# Patient Record
Sex: Female | Born: 1990 | Race: Black or African American | Hispanic: No | Marital: Married | Smoking: Never smoker
Health system: Southern US, Community
[De-identification: ages and names within clinical notes are randomized; demographics above are authoritative.]

## PROBLEM LIST (undated history)

## (undated) ENCOUNTER — Inpatient Hospital Stay (HOSPITAL_COMMUNITY): Payer: Self-pay

## (undated) DIAGNOSIS — T4145XA Adverse effect of unspecified anesthetic, initial encounter: Secondary | ICD-10-CM

## (undated) DIAGNOSIS — Z789 Other specified health status: Secondary | ICD-10-CM

## (undated) DIAGNOSIS — G971 Other reaction to spinal and lumbar puncture: Secondary | ICD-10-CM

## (undated) DIAGNOSIS — T8859XA Other complications of anesthesia, initial encounter: Secondary | ICD-10-CM

## (undated) DIAGNOSIS — L309 Dermatitis, unspecified: Secondary | ICD-10-CM

## (undated) DIAGNOSIS — N907 Vulvar cyst: Secondary | ICD-10-CM

## (undated) HISTORY — PX: EYE SURGERY: SHX253

## (undated) HISTORY — PX: OTHER SURGICAL HISTORY: SHX169

## (undated) HISTORY — PX: TONSILLECTOMY: SUR1361

## (undated) HISTORY — DX: Dermatitis, unspecified: L30.9

---
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
## 2014-05-28 ENCOUNTER — Emergency Department (INDEPENDENT_AMBULATORY_CARE_PROVIDER_SITE_OTHER)
Admission: EM | Admit: 2014-05-28 | Discharge: 2014-05-28 | Disposition: A | Discharge status: Home / Self Care | Payer: BC Managed Care – PPO | Source: Home / Self Care | Attending: Emergency Medicine | Admitting: Emergency Medicine

## 2014-05-28 ENCOUNTER — Encounter (HOSPITAL_COMMUNITY): Payer: Self-pay | Admitting: Emergency Medicine

## 2014-05-28 DIAGNOSIS — Z3201 Encounter for pregnancy test, result positive: Secondary | ICD-10-CM | POA: Diagnosis not present

## 2014-05-28 DIAGNOSIS — R110 Nausea: Secondary | ICD-10-CM

## 2014-05-28 DIAGNOSIS — Z3490 Encounter for supervision of normal pregnancy, unspecified, unspecified trimester: Secondary | ICD-10-CM

## 2014-05-28 LAB — POCT PREGNANCY, URINE: Preg Test, Ur: POSITIVE — AB

## 2014-05-28 NOTE — ED Provider Notes (Signed)
CSN: [id number]     Arrival date & time 05/28/14  1738 History   First MD Initiated Contact with Patient 05/28/14 1853     Chief Complaint  Patient presents with  . Emesis  . Fatigue   (Consider location/radiation/quality/duration/timing/severity/associated sxs/prior Treatment) HPI Comments: Patient presents with a 5 day history of nausea; worse in the am, with "overwhelming" fatigue and sensitivity to the smell. She reports a possible subjective fever on Monday; without a return. Denies abdominal pain, diarrhea or known exposures. Denies urinary symptoms. See remainder of ROS.   Patient is a 24 y.o. female presenting with vomiting. The history is provided by the patient.  Emesis Associated symptoms: no diarrhea     History reviewed. No pertinent past medical history. History reviewed. No pertinent past surgical history. History reviewed. No pertinent family history. History  Substance Use Topics  . Smoking status: Never Smoker   . Smokeless tobacco: Not on file  . Alcohol Use: No   OB History    No data available     Review of Systems  Gastrointestinal: Positive for vomiting. Negative for diarrhea, constipation, abdominal distention and rectal pain.  Genitourinary: Negative for dysuria, urgency, frequency, flank pain, vaginal bleeding, vaginal discharge, pelvic pain and dyspareunia.  Musculoskeletal: Negative.   Psychiatric/Behavioral: Negative.   All other systems reviewed and are negative.   Allergies  Review of patient's allergies indicates no known allergies.  Home Medications   Prior to Admission medications   Not on File   BP 122/70 mmHg  Pulse 66  Temp(Src) 98.3 F (36.8 C) (Oral)  Resp 12  SpO2 100%  LMP 05/05/2014 Physical Exam  Constitutional: She is oriented to person, place, and time. She appears well-developed and well-nourished. No distress.  HENT:  Head: Normocephalic and atraumatic.  Pulmonary/Chest: Effort normal and breath sounds normal.   Abdominal: Soft. There is no tenderness. There is no rebound and no guarding.  Neurological: She is alert and oriented to person, place, and time.  Skin: Skin is warm and dry. She is not diaphoretic.  Psychiatric: Her behavior is normal.  Nursing note and vitals reviewed.   ED Course  Procedures (including critical care time) Labs Review Labs Reviewed  POCT PREGNANCY, URINE - Abnormal; Notable for the following:    Preg Test, Ur POSITIVE (*)    All other components within normal limits    Imaging Review No results found.   MDM   1. Pregnant   2. Nausea    Couple happy with news. No PE findings or symptoms to suggest another cause of her symptoms at this time. Recommend GYN, initiation of Pre-natal vitamins and use of Emetrol if needed for nausea. Education given. F/U if worsening symptoms.     Riki SheerMichelle G Thomasena Vandenheuvel, PA-C 05/28/14 1934

## 2014-05-28 NOTE — Discharge Instructions (Signed)
Medicines During Pregnancy During pregnancy, there are medicines that are either safe or unsafe to take. Medicines include prescriptions from your caregiver, over-the-counter medicines, topical creams applied to the skin, and all herbal substances. Medicines are put into either Class A, B, C, or D. Class A and B medicines have been shown to be safe in pregnancy. Class C medicines are also considered to be safe in pregnancy, but these medicines should only be used when necessary. Class D medicines should not be used at all in pregnancy. They can be harmful to a baby.  It is best to take as little medicine as possible while pregnant. However, some medicines are necessary to take for the mother and baby's health. Sometimes, it is more dangerous to stop taking certain medicines than to stay on them. This is often the case for people with long-term (chronic) conditions such as asthma, diabetes, or high blood pressure (hypertension). If you are pregnant and have a chronic illness, call your caregiver right away. Bring a list of your medicines and their doses to your appointments. If you are planning to become pregnant, schedule a doctor's appointment and discuss your medicines with your caregiver. Lastly, write down the phone number to your pharmacist. They can answer questions regarding a medicine's class and safety. They cannot give advice as to whether you should or should not be on a medicine.  SAFE AND UNSAFE MEDICINES There is a long list of medicines that are considered safe in pregnancy. Below is a shorter list. For specific medicines, ask your caregiver.  AllergyMedicines Loratadine, cetirizine, and chlorpheniramine are safe to take. Certain nasal steroid sprays are safe. Talk to your caregiver about specific brands that are safe. Analgesics Acetaminophen and acetaminophen with codeine are safe to take. All other nonsteroidal anti-inflammatory drugs (NSAIDS) are not safe. This includes ibuprofen.    Antacids Many over-the-counter antacids are safe to take. Talk to your caregiver about specific brands that are safe. Famotidine, ranitidine, and lansoprazole are safe. Omepresole is considered safe to take in the second trimester. Antibiotic Medicines There are several antibiotics to avoid. These include, but are not limited to, tetracyline, quinolones, and sulfa medications. Talk to your caregiver before taking any antibiotic.  Antihistamines Talk to your caregiver about specific brands that are safe.  Asthma Medicines Most asthma steroid inhalers are safe to take. Talk to your caregiver for specific details. Calcium Calcium supplements are safe to take. Do not take oyster shell calcium.  Cough and Cold Medicines It is safe to take products with guaifenesin or dextromethorphan. Talk to your caregiver about specific brands that are safe. It is not safe to take products that contain aspirin or ibuprofen. Decongestant Medicines Pseudoephedrine-containing products are safe to take in the second and third trimester.  Depression Medicines Talk about these medicines with your caregiver.  Antidiarrheal Medicines It is safe to take loperamide. Talk to your caregiver about specific brands that are safe. It is not safe to take any antidiarrheal medicine that contains bismuth. Eyedrops Allergy eyedrops should be limited.  Iron It is safe to use certain iron-containing medicines for anemia in pregnancy. They require a prescription.  Antinausea Medicines It is safe to take doxylamine and vitamin B6 as directed. There are other prescription medicines available, if needed.  Sleep aids It is safe to take diphenhydramine and acetaminophen with diphenhydramine.  Steroids Hydrocortisone creams are safe to use as directed. Oral steroids require a prescription. It is not safe to take any hemorrhoid cream with pramoxine or phenylephrine.  Stool softener It is safe to take stool softener  medicines. Avoid daily or prolonged use of stool softeners. Thyroid Medicine It is important to stay on this thyroid medicine. It needs to be followed by your caregiver.  Vaginal Medicines Your caregiver will prescribe a medicine to you if you have a vaginal infection. Certain antifungal medicines are safe to use if you have a sexually transmitted infection (STI). Talk to your caregiver.  Document Released: 03/17/2005 Document Revised: 06/09/2011 Document Reviewed: 03/18/2011 [facility] Patient Information 2015 [employer], [place]. This information is not intended to replace advice given to you by your health care provider. Make sure you discuss any questions you have with your health care provider.     Ok to try The Sherwin-WilliamsEmetrol OTC for nausea. Start Prenatal vitamins. Call number provided for an appt.

## 2014-05-28 NOTE — ED Notes (Signed)
C/o  N/v.  Fatigue.  Fever.   And sensitivity to smell.  On set Monday.  Symptoms gradually getting worse.

## 2014-06-28 LAB — OB RESULTS CONSOLE HEPATITIS B SURFACE ANTIGEN: Hepatitis B Surface Ag: NEGATIVE

## 2014-06-28 LAB — OB RESULTS CONSOLE RUBELLA ANTIBODY, IGM: Rubella: IMMUNE

## 2014-06-28 LAB — OB RESULTS CONSOLE HIV ANTIBODY (ROUTINE TESTING): HIV: NONREACTIVE

## 2014-06-28 LAB — OB RESULTS CONSOLE GC/CHLAMYDIA
Chlamydia: NEGATIVE
GC PROBE AMP, GENITAL: NEGATIVE

## 2014-06-28 LAB — OB RESULTS CONSOLE ABO/RH: RH Type: POSITIVE

## 2014-06-28 LAB — OB RESULTS CONSOLE RPR: RPR: NONREACTIVE

## 2014-06-28 LAB — OB RESULTS CONSOLE ANTIBODY SCREEN: Antibody Screen: NEGATIVE

## 2014-10-11 ENCOUNTER — Inpatient Hospital Stay (HOSPITAL_COMMUNITY): Admission: AD | Admit: 2014-10-11 | Payer: Self-pay | Source: Ambulatory Visit | Admitting: Obstetrics and Gynecology

## 2014-12-28 LAB — OB RESULTS CONSOLE GBS: STREP GROUP B AG: NEGATIVE

## 2015-01-22 ENCOUNTER — Inpatient Hospital Stay (HOSPITAL_COMMUNITY)
Admission: AD | Admit: 2015-01-22 | Discharge: 2015-01-22 | Disposition: A | Discharge status: Home / Self Care | Payer: Medicaid Other | Source: Ambulatory Visit | Attending: Obstetrics & Gynecology | Admitting: Obstetrics & Gynecology

## 2015-01-22 ENCOUNTER — Encounter (HOSPITAL_COMMUNITY): Payer: Self-pay | Admitting: *Deleted

## 2015-01-22 DIAGNOSIS — Z3483 Encounter for supervision of other normal pregnancy, third trimester: Secondary | ICD-10-CM

## 2015-01-22 DIAGNOSIS — Z3A40 40 weeks gestation of pregnancy: Secondary | ICD-10-CM | POA: Insufficient documentation

## 2015-01-22 DIAGNOSIS — O471 False labor at or after 37 completed weeks of gestation: Secondary | ICD-10-CM | POA: Diagnosis not present

## 2015-01-22 HISTORY — DX: Other specified health status: Z78.9

## 2015-01-22 NOTE — MAU Provider Note (Signed)
Holly Glass is a 24 y.o. G1P0 at 40.0 weeks presented unannounced c/o ctx.  Denies vb orlof w+fm   History     There are no active problems to display for this patient.   No chief complaint on file.  HPI  OB History    Gravida Para Term Preterm AB TAB SAB Ectopic Multiple Living   1               Past Medical History  Diagnosis Date  . Medical history non-contributory     Past Surgical History  Procedure Laterality Date  . Tonsillectomy    . Eye surgery    . Addenoidectomy      Family History  Problem Relation Age of Onset  . Hypertension Mother   . Hypertension Father   . Arthritis Maternal Grandmother   . Heart disease Maternal Grandmother   . Cancer Maternal Grandfather   . Alcohol abuse Paternal Grandmother     Social History  Substance Use Topics  . Smoking status: Never Smoker   . Smokeless tobacco: None  . Alcohol Use: No    Allergies:  Allergies  Allergen Reactions  . Zyrtec Allergy [Cetirizine Hcl] Hives    Only Liquid zyrtec gives allergy    Prescriptions prior to admission  Medication Sig Dispense Refill Last Dose  . prenatal vitamin w/FE, FA (PRENATAL 1 + 1) 27-1 MG TABS tablet Take 1 tablet by mouth daily at 12 noon.   01/22/2015 at Unknown time    ROS See HPI above, all other systems are negative  Physical Exam   Blood pressure 144/83, pulse 88, temperature 98.5 F (36.9 C), temperature source Oral, resp. rate 18, height 5' (1.524 m), weight 205 lb (92.987 kg), last menstrual period 05/05/2014, SpO2 100 %.  Physical Exam Ext:  WNL ABD: Soft, non tender to palpation, no rebound or guarding SVE: FP/T/H   ED Course  Assessment: IUP at  40.0weeks Membranes: intact FHR: Category 1 CTX:  None/occasional   Plan: -Discussed need to follow up in office  -Bleeding and labor Precautions -Encouraged to call if any questions or concerns arise prior to next scheduled office visit.  -Discharged to home in stable condition -Kick  counts    Holly Glass, CNM, MSN 01/22/2015. 11:11 PM

## 2015-01-22 NOTE — MAU Note (Signed)
Pt reports contractions all day today, also reports a gush of fluid on Saturday but none since.

## 2015-01-22 NOTE — Discharge Instructions (Signed)
Fetal Movement Counts  Patient Name: __________________________________________________ Patient Due Date: ____________________  Performing a fetal movement count is highly recommended in high-risk pregnancies, but it is good for every pregnant woman to do. Your health care provider may ask you to start counting fetal movements at 28 weeks of the pregnancy. Fetal movements often increase:  · After eating a full meal.  · After physical activity.  · After eating or drinking something sweet or cold.  · At rest.  Pay attention to when you feel the baby is most active. This will help you notice a pattern of your baby's sleep and wake cycles and what factors contribute to an increase in fetal movement. It is important to perform a fetal movement count at the same time each day when your baby is normally most active.   HOW TO COUNT FETAL MOVEMENTS  1. Find a quiet and comfortable area to sit or lie down on your left side. Lying on your left side provides the best blood and oxygen circulation to your baby.  2. Write down the day and time on a sheet of paper or in a journal.  3. Start counting kicks, flutters, swishes, rolls, or jabs in a 2-hour period. You should feel at least 10 movements within 2 hours.  4. If you do not feel 10 movements in 2 hours, wait 2-3 hours and count again. Look for a change in the pattern or not enough counts in 2 hours.  SEEK MEDICAL CARE IF:  · You feel less than 10 counts in 2 hours, tried twice.  · There is no movement in over an hour.  · The pattern is changing or taking longer each day to reach 10 counts in 2 hours.  · You feel the baby is not moving as he or she usually does.  Date: ____________ Movements: ____________ Start time: ____________ Finish time: ____________   Date: ____________ Movements: ____________ Start time: ____________ Finish time: ____________  Date: ____________ Movements: ____________ Start time: ____________ Finish time: ____________  Date: ____________ Movements:  ____________ Start time: ____________ Finish time: ____________  Date: ____________ Movements: ____________ Start time: ____________ Finish time: ____________  Date: ____________ Movements: ____________ Start time: ____________ Finish time: ____________  Date: ____________ Movements: ____________ Start time: ____________ Finish time: ____________  Date: ____________ Movements: ____________ Start time: ____________ Finish time: ____________   Date: ____________ Movements: ____________ Start time: ____________ Finish time: ____________  Date: ____________ Movements: ____________ Start time: ____________ Finish time: ____________  Date: ____________ Movements: ____________ Start time: ____________ Finish time: ____________  Date: ____________ Movements: ____________ Start time: ____________ Finish time: ____________  Date: ____________ Movements: ____________ Start time: ____________ Finish time: ____________  Date: ____________ Movements: ____________ Start time: ____________ Finish time: ____________  Date: ____________ Movements: ____________ Start time: ____________ Finish time: ____________   Date: ____________ Movements: ____________ Start time: ____________ Finish time: ____________  Date: ____________ Movements: ____________ Start time: ____________ Finish time: ____________  Date: ____________ Movements: ____________ Start time: ____________ Finish time: ____________  Date: ____________ Movements: ____________ Start time: ____________ Finish time: ____________  Date: ____________ Movements: ____________ Start time: ____________ Finish time: ____________  Date: ____________ Movements: ____________ Start time: ____________ Finish time: ____________  Date: ____________ Movements: ____________ Start time: ____________ Finish time: ____________   Date: ____________ Movements: ____________ Start time: ____________ Finish time: ____________  Date: ____________ Movements: ____________ Start time: ____________ Finish  time: ____________  Date: ____________ Movements: ____________ Start time: ____________ Finish time: ____________  Date: ____________ Movements: ____________ Start time:   ____________ Finish time: ____________  Date: ____________ Movements: ____________ Start time: ____________ Finish time: ____________  Date: ____________ Movements: ____________ Start time: ____________ Finish time: ____________  Date: ____________ Movements: ____________ Start time: ____________ Finish time: ____________   Date: ____________ Movements: ____________ Start time: ____________ Finish time: ____________  Date: ____________ Movements: ____________ Start time: ____________ Finish time: ____________  Date: ____________ Movements: ____________ Start time: ____________ Finish time: ____________  Date: ____________ Movements: ____________ Start time: ____________ Finish time: ____________  Date: ____________ Movements: ____________ Start time: ____________ Finish time: ____________  Date: ____________ Movements: ____________ Start time: ____________ Finish time: ____________  Date: ____________ Movements: ____________ Start time: ____________ Finish time: ____________   Date: ____________ Movements: ____________ Start time: ____________ Finish time: ____________  Date: ____________ Movements: ____________ Start time: ____________ Finish time: ____________  Date: ____________ Movements: ____________ Start time: ____________ Finish time: ____________  Date: ____________ Movements: ____________ Start time: ____________ Finish time: ____________  Date: ____________ Movements: ____________ Start time: ____________ Finish time: ____________  Date: ____________ Movements: ____________ Start time: ____________ Finish time: ____________  Date: ____________ Movements: ____________ Start time: ____________ Finish time: ____________   Date: ____________ Movements: ____________ Start time: ____________ Finish time: ____________  Date: ____________  Movements: ____________ Start time: ____________ Finish time: ____________  Date: ____________ Movements: ____________ Start time: ____________ Finish time: ____________  Date: ____________ Movements: ____________ Start time: ____________ Finish time: ____________  Date: ____________ Movements: ____________ Start time: ____________ Finish time: ____________  Date: ____________ Movements: ____________ Start time: ____________ Finish time: ____________  Date: ____________ Movements: ____________ Start time: ____________ Finish time: ____________   Date: ____________ Movements: ____________ Start time: ____________ Finish time: ____________  Date: ____________ Movements: ____________ Start time: ____________ Finish time: ____________  Date: ____________ Movements: ____________ Start time: ____________ Finish time: ____________  Date: ____________ Movements: ____________ Start time: ____________ Finish time: ____________  Date: ____________ Movements: ____________ Start time: ____________ Finish time: ____________  Date: ____________ Movements: ____________ Start time: ____________ Finish time: ____________     This information is not intended to replace advice given to you by your health care provider. Make sure you discuss any questions you have with your health care provider.     Document Released: 04/16/2006 Document Revised: 04/07/2014 Document Reviewed: 01/12/2012  Elsevier Interactive Patient Education ©2016 [employer]

## 2015-01-30 ENCOUNTER — Encounter (HOSPITAL_COMMUNITY): Payer: Self-pay | Admitting: *Deleted

## 2015-01-30 ENCOUNTER — Telehealth (HOSPITAL_COMMUNITY): Payer: Self-pay | Admitting: *Deleted

## 2015-01-30 NOTE — Telephone Encounter (Signed)
Preadmission screen  

## 2015-01-31 ENCOUNTER — Other Ambulatory Visit: Payer: Self-pay | Admitting: Obstetrics and Gynecology

## 2015-02-03 ENCOUNTER — Inpatient Hospital Stay (HOSPITAL_COMMUNITY): Payer: Medicaid Other | Admitting: Anesthesiology

## 2015-02-03 ENCOUNTER — Encounter (HOSPITAL_COMMUNITY)
Admission: AD | Disposition: A | Discharge status: Home / Self Care | Payer: Self-pay | Source: Ambulatory Visit | Attending: Obstetrics and Gynecology

## 2015-02-03 ENCOUNTER — Inpatient Hospital Stay (HOSPITAL_COMMUNITY): Payer: Medicaid Other

## 2015-02-03 ENCOUNTER — Inpatient Hospital Stay (HOSPITAL_COMMUNITY)
Admission: AD | Admit: 2015-02-03 | Discharge: 2015-02-03 | Disposition: A | Discharge status: Home / Self Care | Payer: Medicaid Other | Source: Home / Self Care | Attending: Obstetrics and Gynecology | Admitting: Obstetrics and Gynecology

## 2015-02-03 ENCOUNTER — Encounter (HOSPITAL_COMMUNITY): Payer: Self-pay

## 2015-02-03 ENCOUNTER — Inpatient Hospital Stay (HOSPITAL_COMMUNITY)
Admission: AD | Admit: 2015-02-03 | Discharge: 2015-02-06 | DRG: 765 | Disposition: A | Discharge status: Home / Self Care | Payer: Medicaid Other | Source: Ambulatory Visit | Attending: Obstetrics and Gynecology | Admitting: Obstetrics and Gynecology

## 2015-02-03 DIAGNOSIS — O9902 Anemia complicating childbirth: Secondary | ICD-10-CM | POA: Diagnosis present

## 2015-02-03 DIAGNOSIS — Z6841 Body Mass Index (BMI) 40.0 and over, adult: Secondary | ICD-10-CM

## 2015-02-03 DIAGNOSIS — O99214 Obesity complicating childbirth: Secondary | ICD-10-CM | POA: Diagnosis present

## 2015-02-03 DIAGNOSIS — Z809 Family history of malignant neoplasm, unspecified: Secondary | ICD-10-CM

## 2015-02-03 DIAGNOSIS — Z3A41 41 weeks gestation of pregnancy: Secondary | ICD-10-CM | POA: Diagnosis not present

## 2015-02-03 DIAGNOSIS — L309 Dermatitis, unspecified: Secondary | ICD-10-CM | POA: Diagnosis present

## 2015-02-03 DIAGNOSIS — O480 Post-term pregnancy: Secondary | ICD-10-CM | POA: Diagnosis present

## 2015-02-03 DIAGNOSIS — Z98891 History of uterine scar from previous surgery: Secondary | ICD-10-CM

## 2015-02-03 DIAGNOSIS — Z8261 Family history of arthritis: Secondary | ICD-10-CM

## 2015-02-03 DIAGNOSIS — Z8249 Family history of ischemic heart disease and other diseases of the circulatory system: Secondary | ICD-10-CM

## 2015-02-03 DIAGNOSIS — O9962 Diseases of the digestive system complicating childbirth: Secondary | ICD-10-CM | POA: Diagnosis present

## 2015-02-03 DIAGNOSIS — O288 Other abnormal findings on antenatal screening of mother: Secondary | ICD-10-CM

## 2015-02-03 DIAGNOSIS — Z811 Family history of alcohol abuse and dependence: Secondary | ICD-10-CM

## 2015-02-03 DIAGNOSIS — K219 Gastro-esophageal reflux disease without esophagitis: Secondary | ICD-10-CM | POA: Diagnosis present

## 2015-02-03 LAB — CBC
HEMATOCRIT: 33.2 % — AB (ref 36.0–46.0)
Hemoglobin: 10.2 g/dL — ABNORMAL LOW (ref 12.0–15.0)
MCH: 23 pg — AB (ref 26.0–34.0)
MCHC: 30.7 g/dL (ref 30.0–36.0)
MCV: 74.8 fL — AB (ref 78.0–100.0)
PLATELETS: 247 10*3/uL (ref 150–400)
RBC: 4.44 MIL/uL (ref 3.87–5.11)
RDW: 15.7 % — ABNORMAL HIGH (ref 11.5–15.5)
WBC: 10.5 10*3/uL (ref 4.0–10.5)

## 2015-02-03 LAB — ABO/RH: ABO/RH(D): O POS

## 2015-02-03 LAB — TYPE AND SCREEN
ABO/RH(D): O POS
Antibody Screen: NEGATIVE

## 2015-02-03 LAB — RPR: RPR Ser Ql: NONREACTIVE

## 2015-02-03 SURGERY — Surgical Case
Anesthesia: Spinal

## 2015-02-03 MED ORDER — NALOXONE HCL 0.4 MG/ML IJ SOLN: 0.4000 mg | INTRAMUSCULAR | Status: DC | PRN

## 2015-02-03 MED ORDER — MEPERIDINE HCL 25 MG/ML IJ SOLN: 6.2500 mg | INTRAMUSCULAR | Status: DC | PRN

## 2015-02-03 MED ORDER — LACTATED RINGERS IV SOLN
INTRAVENOUS | Status: DC
  Administered 2015-02-03 (×3): via INTRAVENOUS

## 2015-02-03 MED ORDER — SODIUM CHLORIDE 0.9 % IJ SOLN: 3.0000 mL | INTRAMUSCULAR | Status: DC | PRN

## 2015-02-03 MED ORDER — BUPIVACAINE IN DEXTROSE 0.75-8.25 % IT SOLN
INTRATHECAL | Status: DC | PRN
  Administered 2015-02-03: 9 mg via INTRATHECAL

## 2015-02-03 MED ORDER — NALBUPHINE HCL 10 MG/ML IJ SOLN: 5.0000 mg | Freq: Once | INTRAMUSCULAR | Status: DC | PRN

## 2015-02-03 MED ORDER — CITRIC ACID-SODIUM CITRATE 334-500 MG/5ML PO SOLN
30.0000 mL | ORAL | Status: AC
  Administered 2015-02-03: 30 mL via ORAL

## 2015-02-03 MED ORDER — OXYTOCIN 10 UNIT/ML IJ SOLN
INTRAMUSCULAR | Status: AC
  Filled 2015-02-03: qty 4

## 2015-02-03 MED ORDER — CEFAZOLIN SODIUM-DEXTROSE 2-3 GM-% IV SOLR
2.0000 g | INTRAVENOUS | Status: AC
  Administered 2015-02-03: 2 g via INTRAVENOUS
  Filled 2015-02-03: qty 50

## 2015-02-03 MED ORDER — IBUPROFEN 600 MG PO TABS: 600.0000 mg | ORAL_TABLET | Freq: Four times a day (QID) | ORAL | Status: DC | PRN

## 2015-02-03 MED ORDER — OXYTOCIN 10 UNIT/ML IJ SOLN
40.0000 [IU] | INTRAMUSCULAR | Status: DC | PRN
  Administered 2015-02-03: 40 [IU] via INTRAVENOUS

## 2015-02-03 MED ORDER — MISOPROSTOL 25 MCG QUARTER TABLET
25.0000 ug | ORAL_TABLET | ORAL | Status: DC | PRN
Start: 2015-02-03 — End: 2015-02-04
  Administered 2015-02-03 (×2): 25 ug via VAGINAL
  Filled 2015-02-03 (×3): qty 0.25

## 2015-02-03 MED ORDER — OXYCODONE-ACETAMINOPHEN 5-325 MG PO TABS: 2.0000 | ORAL_TABLET | ORAL | Status: DC | PRN

## 2015-02-03 MED ORDER — KETOROLAC TROMETHAMINE 30 MG/ML IJ SOLN
30.0000 mg | Freq: Four times a day (QID) | INTRAMUSCULAR | Status: AC | PRN
  Administered 2015-02-04: 30 mg via INTRAMUSCULAR

## 2015-02-03 MED ORDER — KETOROLAC TROMETHAMINE 30 MG/ML IJ SOLN: 30.0000 mg | Freq: Four times a day (QID) | INTRAMUSCULAR | Status: AC | PRN

## 2015-02-03 MED ORDER — ONDANSETRON HCL 4 MG/2ML IJ SOLN
INTRAMUSCULAR | Status: AC
  Filled 2015-02-03: qty 2

## 2015-02-03 MED ORDER — CITRIC ACID-SODIUM CITRATE 334-500 MG/5ML PO SOLN
30.0000 mL | ORAL | Status: DC | PRN
  Filled 2015-02-03: qty 15

## 2015-02-03 MED ORDER — FENTANYL CITRATE (PF) 100 MCG/2ML IJ SOLN
INTRAMUSCULAR | Status: AC
  Filled 2015-02-03: qty 4

## 2015-02-03 MED ORDER — PHENYLEPHRINE 8 MG IN D5W 100 ML (0.08MG/ML) PREMIX OPTIME
INJECTION | INTRAVENOUS | Status: AC
  Filled 2015-02-03: qty 100

## 2015-02-03 MED ORDER — PROMETHAZINE HCL 25 MG/ML IJ SOLN
INTRAMUSCULAR | Status: AC
  Filled 2015-02-03: qty 1

## 2015-02-03 MED ORDER — ONDANSETRON HCL 4 MG/2ML IJ SOLN
4.0000 mg | Freq: Three times a day (TID) | INTRAMUSCULAR | Status: DC | PRN
  Administered 2015-02-04: 4 mg via INTRAVENOUS
  Filled 2015-02-03: qty 2

## 2015-02-03 MED ORDER — FENTANYL 2.5 MCG/ML BUPIVACAINE 1/10 % EPIDURAL INFUSION (WH - ANES): 14.0000 mL/h | INTRAMUSCULAR | Status: DC | PRN

## 2015-02-03 MED ORDER — ACETAMINOPHEN 325 MG PO TABS
650.0000 mg | ORAL_TABLET | ORAL | Status: DC | PRN
  Administered 2015-02-03: 650 mg via ORAL
  Filled 2015-02-03: qty 2

## 2015-02-03 MED ORDER — DEXAMETHASONE SODIUM PHOSPHATE 10 MG/ML IJ SOLN
INTRAMUSCULAR | Status: AC
  Filled 2015-02-03: qty 1

## 2015-02-03 MED ORDER — OXYTOCIN 40 UNITS IN LACTATED RINGERS INFUSION - SIMPLE MED: 62.5000 mL/h | INTRAVENOUS | Status: DC

## 2015-02-03 MED ORDER — TERBUTALINE SULFATE 1 MG/ML IJ SOLN: 0.2500 mg | Freq: Once | INTRAMUSCULAR | Status: DC | PRN

## 2015-02-03 MED ORDER — DIPHENHYDRAMINE HCL 50 MG/ML IJ SOLN: 12.5000 mg | INTRAMUSCULAR | Status: DC | PRN

## 2015-02-03 MED ORDER — FENTANYL CITRATE (PF) 100 MCG/2ML IJ SOLN: 25.0000 ug | INTRAMUSCULAR | Status: DC | PRN

## 2015-02-03 MED ORDER — EPHEDRINE 5 MG/ML INJ: 10.0000 mg | INTRAVENOUS | Status: DC | PRN

## 2015-02-03 MED ORDER — SCOPOLAMINE 1 MG/3DAYS TD PT72
MEDICATED_PATCH | TRANSDERMAL | Status: DC | PRN
  Administered 2015-02-03: 1 via TRANSDERMAL

## 2015-02-03 MED ORDER — OXYCODONE-ACETAMINOPHEN 5-325 MG PO TABS: 1.0000 | ORAL_TABLET | ORAL | Status: DC | PRN

## 2015-02-03 MED ORDER — OXYTOCIN 40 UNITS IN LACTATED RINGERS INFUSION - SIMPLE MED: 1.0000 m[IU]/min | INTRAVENOUS | Status: DC

## 2015-02-03 MED ORDER — PHENYLEPHRINE 40 MCG/ML (10ML) SYRINGE FOR IV PUSH (FOR BLOOD PRESSURE SUPPORT): 80.0000 ug | PREFILLED_SYRINGE | INTRAVENOUS | Status: DC | PRN

## 2015-02-03 MED ORDER — SCOPOLAMINE 1 MG/3DAYS TD PT72
MEDICATED_PATCH | TRANSDERMAL | Status: AC
  Filled 2015-02-03: qty 1

## 2015-02-03 MED ORDER — NALOXONE HCL 2 MG/2ML IJ SOSY
1.0000 ug/kg/h | PREFILLED_SYRINGE | INTRAVENOUS | Status: DC | PRN
  Filled 2015-02-03: qty 2

## 2015-02-03 MED ORDER — LACTATED RINGERS IV SOLN
INTRAVENOUS | Status: DC | PRN
  Administered 2015-02-03: 22:00:00 via INTRAVENOUS

## 2015-02-03 MED ORDER — ONDANSETRON HCL 4 MG/2ML IJ SOLN: 4.0000 mg | Freq: Four times a day (QID) | INTRAMUSCULAR | Status: DC | PRN

## 2015-02-03 MED ORDER — MORPHINE SULFATE (PF) 0.5 MG/ML IJ SOLN
INTRAMUSCULAR | Status: DC | PRN
Start: 2015-02-03 — End: 2015-02-03
  Administered 2015-02-03: .15 mg via INTRATHECAL

## 2015-02-03 MED ORDER — PROMETHAZINE HCL 25 MG/ML IJ SOLN
INTRAMUSCULAR | Status: DC | PRN
  Administered 2015-02-03 (×2): 6.25 mg via INTRAVENOUS

## 2015-02-03 MED ORDER — ONDANSETRON HCL 4 MG/2ML IJ SOLN
INTRAMUSCULAR | Status: DC | PRN
  Administered 2015-02-03: 4 mg via INTRAVENOUS

## 2015-02-03 MED ORDER — NALBUPHINE HCL 10 MG/ML IJ SOLN
5.0000 mg | INTRAMUSCULAR | Status: DC | PRN
Start: 2015-02-03 — End: 2015-02-06

## 2015-02-03 MED ORDER — LIDOCAINE HCL (PF) 1 % IJ SOLN: 30.0000 mL | INTRAMUSCULAR | Status: DC | PRN

## 2015-02-03 MED ORDER — DEXAMETHASONE SODIUM PHOSPHATE 10 MG/ML IJ SOLN
INTRAMUSCULAR | Status: DC | PRN
  Administered 2015-02-03: 10 mg via INTRAVENOUS

## 2015-02-03 MED ORDER — DIPHENHYDRAMINE HCL 25 MG PO CAPS
25.0000 mg | ORAL_CAPSULE | ORAL | Status: DC | PRN
  Filled 2015-02-03: qty 1

## 2015-02-03 MED ORDER — FLEET ENEMA 7-19 GM/118ML RE ENEM: 1.0000 | ENEMA | RECTAL | Status: DC | PRN

## 2015-02-03 MED ORDER — LACTATED RINGERS IV SOLN
INTRAVENOUS | Status: DC | PRN
  Administered 2015-02-03 (×3): via INTRAVENOUS

## 2015-02-03 MED ORDER — FENTANYL CITRATE (PF) 100 MCG/2ML IJ SOLN
INTRAMUSCULAR | Status: DC | PRN
  Administered 2015-02-03: 25 ug via INTRATHECAL

## 2015-02-03 MED ORDER — OXYTOCIN BOLUS FROM INFUSION: 500.0000 mL | INTRAVENOUS | Status: DC

## 2015-02-03 MED ORDER — LACTATED RINGERS IV SOLN
500.0000 mL | INTRAVENOUS | Status: DC | PRN
Start: 2015-02-03 — End: 2015-02-04
  Administered 2015-02-03 (×3): 500 mL via INTRAVENOUS

## 2015-02-03 MED ORDER — MORPHINE SULFATE (PF) 0.5 MG/ML IJ SOLN
INTRAMUSCULAR | Status: AC
  Filled 2015-02-03: qty 100

## 2015-02-03 MED ORDER — FENTANYL CITRATE (PF) 100 MCG/2ML IJ SOLN: 50.0000 ug | INTRAMUSCULAR | Status: DC | PRN

## 2015-02-03 MED ORDER — PHENYLEPHRINE 8 MG IN D5W 100 ML (0.08MG/ML) PREMIX OPTIME
INJECTION | INTRAVENOUS | Status: DC | PRN
  Administered 2015-02-03: 60 ug/min via INTRAVENOUS

## 2015-02-03 SURGICAL SUPPLY — 40 items
BARRIER ADHS 3X4 INTERCEED (GAUZE/BANDAGES/DRESSINGS) ×3 IMPLANT
BENZOIN TINCTURE PRP APPL 2/3 (GAUZE/BANDAGES/DRESSINGS) ×3 IMPLANT
CLAMP CORD UMBIL (MISCELLANEOUS) IMPLANT
CLOSURE WOUND 1/2 X4 (GAUZE/BANDAGES/DRESSINGS) ×1
CLOTH BEACON ORANGE TIMEOUT ST (SAFETY) ×3 IMPLANT
CONTAINER PREFILL 10% NBF 15ML (MISCELLANEOUS) IMPLANT
DRAIN JACKSON PRT FLT 10 (DRAIN) IMPLANT
DRAPE SHEET LG 3/4 BI-LAMINATE (DRAPES) IMPLANT
DRSG OPSITE POSTOP 4X10 (GAUZE/BANDAGES/DRESSINGS) ×3 IMPLANT
DURAPREP 26ML APPLICATOR (WOUND CARE) ×3 IMPLANT
ELECT REM PT RETURN 9FT ADLT (ELECTROSURGICAL) ×3
ELECTRODE REM PT RTRN 9FT ADLT (ELECTROSURGICAL) ×1 IMPLANT
EVACUATOR SILICONE 100CC (DRAIN) IMPLANT
EXCISOR BIOPSY CONE MED FISHER (MISCELLANEOUS) ×3 IMPLANT
EXTRACTOR VACUUM M CUP 4 TUBE (SUCTIONS) IMPLANT
EXTRACTOR VACUUM M CUP 4' TUBE (SUCTIONS)
GLOVE BIO SURGEON STRL SZ 6.5 (GLOVE) ×2 IMPLANT
GLOVE BIO SURGEONS STRL SZ 6.5 (GLOVE) ×1
GLOVE BIOGEL PI IND STRL 7.0 (GLOVE) ×1 IMPLANT
GLOVE BIOGEL PI INDICATOR 7.0 (GLOVE) ×2
GOWN STRL REUS W/TWL LRG LVL3 (GOWN DISPOSABLE) ×6 IMPLANT
HEMOSTAT SURGICEL 2X14 (HEMOSTASIS) ×3 IMPLANT
KIT ABG SYR 3ML LUER SLIP (SYRINGE) IMPLANT
NEEDLE HYPO 25X5/8 SAFETYGLIDE (NEEDLE) IMPLANT
NS IRRIG 1000ML POUR BTL (IV SOLUTION) ×3 IMPLANT
PACK C SECTION WH (CUSTOM PROCEDURE TRAY) ×3 IMPLANT
PAD OB MATERNITY 4.3X12.25 (PERSONAL CARE ITEMS) ×3 IMPLANT
PENCIL SMOKE EVAC W/HOLSTER (ELECTROSURGICAL) ×3 IMPLANT
RTRCTR C-SECT PINK 25CM LRG (MISCELLANEOUS) IMPLANT
STRIP CLOSURE SKIN 1/2X4 (GAUZE/BANDAGES/DRESSINGS) ×2 IMPLANT
SUT CHROMIC 0 CT 1 (SUTURE) ×3 IMPLANT
SUT MNCRL AB 3-0 PS2 27 (SUTURE) ×3 IMPLANT
SUT PLAIN 2 0 (SUTURE) ×4
SUT PLAIN 2 0 XLH (SUTURE) ×6 IMPLANT
SUT PLAIN ABS 2-0 CT1 27XMFL (SUTURE) ×2 IMPLANT
SUT SILK 2 0 SH (SUTURE) ×6 IMPLANT
SUT VIC AB 0 CTX 36 (SUTURE) ×10
SUT VIC AB 0 CTX36XBRD ANBCTRL (SUTURE) ×5 IMPLANT
TOWEL OR 17X24 6PK STRL BLUE (TOWEL DISPOSABLE) ×3 IMPLANT
TRAY FOLEY CATH SILVER 14FR (SET/KITS/TRAYS/PACK) ×3 IMPLANT

## 2015-02-03 NOTE — Progress Notes (Addendum)
Holly HelperChelsea D Glass MRN: [id number]  Subjective: -Care assumed of 24 y.o. G1P0 at 41.5 wks who presents for IOL secondary to postdates.  In to meet acquaintance and discuss POC.  Patient resting in bed.  Reports comfort and no perception of contractions.  Reports fetal movement.  Family at bedside.   Objective: BP 135/78 mmHg  Pulse 80  Temp(Src) 98.9 F (37.2 C) (Oral)  Resp 20  Ht 5' (1.524 m)  Wt 95.255 kg (210 lb)  BMI 41.01 kg/m2  LMP 05/05/2014     FHT: 145 bpm, Min Var, -Decels, -Accels UC: Occasional   SVE: Deferred Membranes:Intact Pitocin:None Cytotec: S/P 2 doses, last dose at 1330  Assessment:  IUP at 41.5wks Cat II FT  Post Dates IOL Cervical Ripening  Plan: -Dr. Audree CamelN. Dillard consulted regarding fetal strip and advised BPP -In room to discuss POC with patient including BPP via BS US -No questions or concerns -Will reassess after BPP -Continue other mgmt as ordered  Holly Falor LYNN,MSN, CNM 02/03/2015, 7:50 PM

## 2015-02-03 NOTE — Progress Notes (Addendum)
Tera HelperChelsea D Lafitte MRN: [id number]  Subjective: -In room to discuss results of BPP. Patient resting in bed on left side.  Family remains at bedside.   Objective: BP 135/78 mmHg  Pulse 80  Temp(Src) 98.9 F (37.2 C) (Oral)  Resp 20  Ht 5' (1.524 m)  Wt 95.255 kg (210 lb)  BMI 41.01 kg/m2  LMP 05/05/2014     FHT: 150 bpm, Min Var, -Decels, -Accels UC: None graphed   SVE: Deferred Membranes:Intact Pitocin:None BPP 6/8 with (-2 for Fluid) with NR NST 6/10  Assessment:  IUP at 41.5wks Cat II FT  Failed Antenatal Testing Oligo PostDates  Plan: -Discussed findings of BPP and current NR strip.   -Patient expresses concern regarding oligo and wonders if SROM occurred without her knowledge.  Reassurances given and education provided.  -Given option of continued mgmt in hopes of vaginal delivery or primary C/S.  Informed that chances of C/S, in face of current vaginal mgmt, are greatly increased based on oligohydramnios and fetal heart rate pattern.  However, patient given option of IV hydration, O2, and position change to promote fetal tolerance and continue induction process.   -Patient offered time to discuss with family and declines, while opting for primary c/s for fetal distress (Cat II FT) remote from delivery secondary to oligohydramnios. -R/B reviewed including, but not limited to, infection, bleeding, pain, damage to organs or fetus resulting in need for additional surgery.  Patient understands and accepts these risks and wishes to proceed with c/s. -Dr. Dorris CarnesN. Dillard updated on patient status and en route -PreOp orders placed including: SCD, CBC, Ancef 2 grams -Continue other mgmt as appropriate   Candiace West LYNN,MSN, CNM 02/03/2015, 9:22 PM

## 2015-02-03 NOTE — Transfer of Care (Signed)
Immediate Anesthesia Transfer of Care Note  Patient: Holly Glass  Procedure(s) Performed: Procedure(s): CESAREAN SECTION (N/A)  Patient Location: PACU  Anesthesia Type:Spinal  Level of Consciousness: awake  Airway & Oxygen Therapy: Patient Spontanous Breathing  Post-op Assessment: Report given to RN and Post -op Vital signs reviewed and stable  Post vital signs: stable  Last Vitals:  Filed Vitals:   02/03/15 1930  BP: 135/78  Pulse: 80  Temp:   Resp:     Complications: No apparent anesthesia complications

## 2015-02-03 NOTE — Anesthesia Postprocedure Evaluation (Signed)
  Anesthesia Post-op Note  Patient: Holly Glass  Procedure(s) Performed: Procedure(s): CESAREAN SECTION (N/A)  Patient Location: PACU  Anesthesia Type:Spinal  Level of Consciousness: awake, alert  and oriented  Airway and Oxygen Therapy: Patient Spontanous Breathing  Post-op Pain: none  Post-op Assessment: Post-op Vital signs reviewed, Patient's Cardiovascular Status Stable, Respiratory Function Stable, Patent Airway, No signs of Nausea or vomiting, Pain level controlled, No headache, No backache and Spinal receding              Post-op Vital Signs: Reviewed and stable  Last Vitals:  Filed Vitals:   02/03/15 2345  BP:   Pulse:   Temp: 37.2 C  Resp:     Complications: No apparent anesthesia complications

## 2015-02-03 NOTE — H&P (Signed)
Holly Glass is a 24 y.o. female, G1P0 at 1541 5/7 weeks, presenting for induction due to postdates.  Denies leaking or bleeding, reports +FM.    Patient Active Problem List   Diagnosis Date Noted  . Post term pregnancy 02/03/2015  . BMI 40.0-44.9, adult (HCC) 02/03/2015  . Eczema 02/03/2015    History of present pregnancy: Patient entered care at 13 2/7 weeks.   ED[facility] of 01/22/15 [facility]   Anatomy scan: 19 2/7 weeks, with normal findings and a posterior placenta.   Additional US evaluations:  29 1/7 weeks for S<D:  EFW 3+9, 88%ile, normal fluid, AFI 16.4, 55%ile 36 3/7 weeks:  EFW 6+11, 65%ile, AFI 21, 80%ile, vtx 41 weeks:  BPP 8/8, AFI 8.28, 15%ile, vtx. Significant prenatal events:  Declined flu vaccine and TDAP.  No complications during pregnancy.  Declined induction at 41 weeks. Last evaluation:  01/29/15--cervix closed, 50%, vtx, -3, BP 120/70.  BPP 8/8, with AFI 8.28.  Patient agreeable with induction at 41 5/7 weeks.  OB History    Gravida Para Term Preterm AB TAB SAB Ectopic Multiple Living   1              Past Medical History  Diagnosis Date  . Medical history non-contributory   . Eczema    Past Surgical History  Procedure Laterality Date  . Tonsillectomy    . Eye surgery    . Addenoidectomy     Family History: family history includes Alcohol abuse in her paternal grandmother; Arthritis in her maternal grandmother; Birth defects in her maternal uncle; Cancer in her maternal grandfather; Heart disease in her maternal grandmother; Heart murmur in her sister; Hypertension in her father and mother; Miscarriages / [place] in her maternal grandmother.   Social History:  reports that she has never smoked. She does not have any smokeless tobacco history on file. She reports that she does not drink alcohol or use illicit drugs.  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     Prenatal Transfer Tool  Maternal Diabetes: No Genetic Screening: Normal Quad screen Maternal Ultrasounds/Referrals: Normal Fetal Ultrasounds or other Referrals:  None Maternal Substance Abuse:  No Significant Maternal Medications:  None Significant Maternal Lab Results: Lab values include: Group B Strep negative  TDAP NA Flu NA  ROS:  +FM, occasional cramping  Allergies  Allergen Reactions  . Zyrtec Allergy [Cetirizine Hcl] Hives    Only Liquid zyrtec gives allergy     Dilation: Closed Effacement (%): Thick Station: Ballotable Exam by:: Manfred ArchV. Yasira Engelson CNM Blood pressure 126/70, pulse 68, temperature 98.9 F (37.2 C), temperature source Oral, resp. rate 18, height 5' (1.524 m), weight 95.255 kg (210 lb), last menstrual period 05/05/2014.  Chest clear Heart RRR without murmur Abd gravid, NT, FH 39 cm Pelvic: Cervix closed at LV Ext: DTR 1+, no clonus, trace edema.  FHR: Category 1 UCs:  None  Prenatal labs: ABO, Rh: --/--/O POS (11/05 0900) Antibody: NEG (11/05 0900) Rubella:  Immune RPR: Non Reactive (11/05 0845)  HBsAg: Negative (03/30 0000)  HIV: Non-reactive (03/30 0000)  GBS: Negative (09/29 0000) Sickle cell/Hgb electrophoresis:  AA Pap:  WNL 06/2014 GC:  Negative 06/28/14 Chlamydia:  Negative 06/28/14 Genetic screenings:  Quad screen WNL Glucola:  WNL Other:   Hgb 12.8 at NOB, 10.4 at 28 weeks   Assessment/Plan: IUP at 41 5/7 weeks--for induction  Unfavorable cervix GBS negative  Plan: Admit to Birthing Suite per consult with Dr. Normand Sloop Routine [facility] induction orders Cytotech initially, with options of foley bulb and pitocin reviewed with patient. Pain med/epidural prn--patient currently declines any plan for epidural use. Risks and benefits of induction were reviewed, including failure of method, prolonged labor, need for further intervention, risk of cesarean.  Patient and family seem to understand these risks and wish  to proceed.   Nigel Bridgeman, CNM, [place] 02/03/2015, 3:08 PM

## 2015-02-03 NOTE — Progress Notes (Addendum)
  Subjective: Resting comfortably in bed--husband and parents at bedside.  Patient unaware of any contractions.  Objective: BP 139/84 mmHg  Pulse 82  Temp(Src) 98.8 F (37.1 C) (Oral)  Resp 20  Ht 5' (1.524 m)  Wt 95.255 kg (210 lb)  BMI 41.01 kg/m2  LMP 05/05/2014   Filed Vitals:   02/03/15 1150 02/03/15 1445 02/03/15 1708 02/03/15 1745  BP: 128/89 126/70 139/84   Pulse: 88 68 82   Temp: 98.9 F (37.2 C)  99.4 F (37.4 C) 98.8 F (37.1 C)  TempSrc: Oral  Oral Oral  Resp: 20 18 20    Height:      Weight:           FHT: Category 2 at present--baseline currently 150-160, moderate variability, occasional variables.  Patient was off the monitor from 1449 to 1704 for opportunity for ambulation.  Category 1 at the time of d/c. Upon re-initation of monitoring, FHR noted to have a moderate variable, with FHR baseline noted at 180-190, moderate variability and spontaneous variables, no UCs. Temp 99.4 at that time. Some difficulty assessing whether baseline was elevated with variables, or baseline lower with prolonged accels.  Tylenol 650 mg po at 1718 IV bolus at 1713 Fetus very active, patient unaware of any UCs.  UC:   None SVE:   Dilation: Closed Effacement (%): Thick Station: Ballotable Exam by:: Manfred ArchV. Woodward Klem CNM at [phone]   Assessment:  Induction for postdates Category 2--fetal tachycardia, moderate variability GBS negative  Plan: Consulted with Dr. Normand Sloopillard. Will continue to observe FHR status for additional hour, then re-evaluate cervix and overall status. Reviewed FHR status and plan of care with patient and her family--they are in agreement with plan of care.  Nigel BridgemanLATHAM, Rip Hawes CNM 02/03/2015, 6:28 PM

## 2015-02-03 NOTE — Anesthesia Procedure Notes (Signed)
Spinal Patient location during procedure: OR Start time: 02/03/2015 9:54 PM Staffing Anesthesiologist: Mal AmabileFOSTER, Yeriel Mineo Performed by: anesthesiologist  Preanesthetic Checklist Completed: patient identified, site marked, surgical consent, pre-op evaluation, timeout performed, IV checked, risks and benefits discussed and monitors and equipment checked Spinal Block Patient position: sitting Prep: site prepped and draped and DuraPrep Patient monitoring: heart rate, cardiac monitor, continuous pulse ox and blood pressure Approach: midline Location: L3-4 Injection technique: single-shot Needle Needle type: Sprotte  Needle gauge: 24 G Needle length: 9 cm Needle insertion depth: 6 cm Assessment Sensory level: T4 Additional Notes Patient tolerated procedure well. Adequate sensory level.

## 2015-02-03 NOTE — Op Note (Signed)
Cesarean Section Procedure Note   [name]  02/03/2015  Indications: Non reassuring fetal testing.     Pre-operative Diagnosis: * No pre-op diagnosis entered *.   Post-operative Diagnosis: Same   Surgeon: Surgeon(s) and Role:    * Jaymes GraffNaima Manila Rommel, MD - Primary   Assistants: Gerrit HeckJessica Emly CNM   Anesthesia: spinal   Procedure Details:  The patient was seen in the Holding Room. The risks, benefits, complications, treatment options, and expected outcomes were discussed with the patient. The patient concurred with the proposed plan, giving informed consent. identified as [name] and the procedure verified as C-Section Delivery. A Time Out was held and the above information confirmed.  After induction of anesthesia, the patient was draped and prepped in the usual sterile manner. A transverse incision was made and carried down through the subcutaneous tissue to the fascia. Fascial incision was made in the midline and extended transversely. The fascia was separated from the underlying rectus muscle superiorly and inferiorly. The peritoneum was identified and entered. Peritoneal incision was extended longitudinally with good visualization of bowel and bladder. The utero-vesical peritoneal reflection was incised transversely and the bladder flap was bluntly freed from the lower uterine segment.  An alexsis retractor was placed in the abdomen.   A low transverse uterine incision was made. Delivered from cephalic presentation was a  infant, with Apgar scores of 8 at one minute and 9 at five minutes. Cord ph was sent the umbilical cord was clamped and cut cord blood was obtained for evaluation. The placenta was removed Intact and appeared normal. The uterine outline, tubes and ovaries appeared normal}. The uterine incision was closed with running locked sutures of 0Vicryl. A second layer 0 vicrlyl was used to imbricate the uterine incision    Hemostasis was observed. Lavage was carried out until  clear. The alexsis was removed.  The peritoneum was closed with 0 chromic. There was a rent in the peritoneum just next to the bladder.  The urine was clear and the bladder was not entered.  The rent was repaired with 3-0 vicryl in layers.    The muscles were examined and any bleeders were made hemostatic using bovie cautery device.   The fascia was then reapproximated with running sutures of 0 vicryl.  The subcutaneous tissue was reapproximated  With interrupted stitches using 2-0 plain gut. The subcuticular closure was performed using 3-260monocryl     Instrument, sponge, and needle counts were correct prior the abdominal closure and were correct at the conclusion of the case.    Findings: infant was delivered from vtx presentation. The fluid was clear but scanty.  The uterus tubes and ovaries appeared normal.     Estimated Blood Loss: 900cc   Total IV Fluids: 3000ml   Urine Output: 200CC OF clear urine  Specimens: placenta to pathology  Complications: no complications  Disposition: PACU - hemodynamically stable.   Maternal Condition: stable   Baby condition / location:  Couplet care / Skin to Skin  Attending Attestation: I performed the procedure.   Signed: Surgeon(s): Jaymes GraffNaima Mathilda Maguire, MD

## 2015-02-03 NOTE — Progress Notes (Signed)
  Subjective: Comfortable, not aware of any UCs.  Objective: BP 128/89 mmHg  Pulse 88  Temp(Src) 98.9 F (37.2 C) (Oral)  Resp 20  Ht 5' (1.524 m)  Wt 95.255 kg (210 lb)  BMI 41.01 kg/m2  LMP 05/05/2014      Filed Vitals:   02/03/15 0819 02/03/15 0916 02/03/15 1150  BP: 147/83 129/71 128/89  Pulse: 115 100 88  Temp: 98.3 F (36.8 C)  98.9 F (37.2 C)  TempSrc: Oral  Oral  Resp: 20  20  Height: 5' (1.524 m)    Weight: 95.255 kg (210 lb)      FHT: Category 1 UC:   None SVE:   Dilation: Closed Effacement (%): Thick Station: Ballotable Exam by:: Manfred ArchV. Zahid Carneiro CNM, vtx to [place]. Cytotech #2 dose placed in posterior fornix  Assessment:  Induction for postdates Unfavorable cervix GBS negative  Plan: Continue cervical ripening. OK for ambulation after 1 hour of monitoring post-Cytotech.  Nigel BridgemanLATHAM, Rahmir Beever CNM 02/03/2015, 1:49 PM

## 2015-02-03 NOTE — Anesthesia Preprocedure Evaluation (Signed)
Anesthesia Evaluation  Patient identified by MRN, date of birth, ID band Patient awake    Reviewed: Allergy & Precautions, NPO status , Patient's Chart, lab work & pertinent test results  Airway Mallampati: II  TM Distance: >3 FB Neck ROM: Full    Dental no notable dental hx. (+) Teeth Intact   Pulmonary neg pulmonary ROS,    Pulmonary exam normal breath sounds clear to auscultation       Cardiovascular negative cardio ROS Normal cardiovascular exam Rhythm:Regular Rate:Normal     Neuro/Psych negative neurological ROS  negative psych ROS   GI/Hepatic Neg liver ROS, GERD  ,  Endo/Other  Morbid obesity  Renal/GU negative Renal ROS  negative genitourinary   Musculoskeletal negative musculoskeletal ROS (+)   Abdominal (+) + obese,   Peds  Hematology  (+) anemia ,   Anesthesia Other Findings   Reproductive/Obstetrics (+) Pregnancy 41 1/7 weeks Fetal intolerance to labor                             Anesthesia Physical Anesthesia Plan  ASA: III and emergent  Anesthesia Plan: Spinal   Post-op Pain Management:    Induction:   Airway Management Planned: Natural Airway  Additional Equipment:   Intra-op Plan:   Post-operative Plan:   Informed Consent: I have reviewed the patients History and Physical, chart, labs and discussed the procedure including the risks, benefits and alternatives for the proposed anesthesia with the patient or authorized representative who has indicated his/her understanding and acceptance.     Plan Discussed with: CRNA, Anesthesiologist and Surgeon  Anesthesia Plan Comments:         Anesthesia Quick Evaluation

## 2015-02-03 NOTE — Progress Notes (Signed)
  Subjective: Doing well--ready for Cytotech  Objective: BP 129/71 mmHg  Pulse 100  Temp(Src) 98.3 F (36.8 C) (Oral)  Resp 20  Ht 5' (1.524 m)  Wt 95.255 kg (210 lb)  BMI 41.01 kg/m2  LMP 05/05/2014      FHT: Category 1 UC:   None SVE:   Dilation: Closed Effacement (%): Thick Station: Ballotable Exam by:: Manfred ArchV. Armen Waring CNM  PP OOP--vtx presentation verified by BS US EFW 8+lbs Cytotech #1 placed in posterior fornix at 0925  Assessment:  Induction for postdates Unfavorable cervix GBS negative  Plan: Cytotech use reviewed with patient and husband, with possible need for serial induction discussed. Re-evaluate in 4 hours or prn. Patient declines epidural use for labor--issues reviewed, recommended patient not eliminate any labor tools at present.  Nigel BridgemanLATHAM, Charon Smedberg CNM 02/03/2015, 9:50 AM

## 2015-02-04 ENCOUNTER — Encounter (HOSPITAL_COMMUNITY): Payer: Self-pay

## 2015-02-04 LAB — CBC
HCT: 28.5 % — ABNORMAL LOW (ref 36.0–46.0)
Hemoglobin: 8.8 g/dL — ABNORMAL LOW (ref 12.0–15.0)
MCH: 23.3 pg — ABNORMAL LOW (ref 26.0–34.0)
MCHC: 30.9 g/dL (ref 30.0–36.0)
MCV: 75.4 fL — ABNORMAL LOW (ref 78.0–100.0)
Platelets: 226 10*3/uL (ref 150–400)
RBC: 3.78 MIL/uL — ABNORMAL LOW (ref 3.87–5.11)
RDW: 15.5 % (ref 11.5–15.5)
WBC: 17.4 10*3/uL — ABNORMAL HIGH (ref 4.0–10.5)

## 2015-02-04 MED ORDER — SIMETHICONE 80 MG PO CHEW: 80.0000 mg | CHEWABLE_TABLET | ORAL | Status: DC | PRN

## 2015-02-04 MED ORDER — PRENATAL MULTIVITAMIN CH
1.0000 | ORAL_TABLET | Freq: Every day | ORAL | Status: DC
  Administered 2015-02-04 – 2015-02-06 (×3): 1 via ORAL
  Filled 2015-02-04 (×3): qty 1

## 2015-02-04 MED ORDER — FERROUS SULFATE 325 (65 FE) MG PO TABS
325.0000 mg | ORAL_TABLET | Freq: Two times a day (BID) | ORAL | Status: DC
  Administered 2015-02-04 – 2015-02-06 (×4): 325 mg via ORAL
  Filled 2015-02-04 (×5): qty 1

## 2015-02-04 MED ORDER — WITCH HAZEL-GLYCERIN EX PADS: 1.0000 "application " | MEDICATED_PAD | CUTANEOUS | Status: DC | PRN

## 2015-02-04 MED ORDER — MENTHOL 3 MG MT LOZG: 1.0000 | LOZENGE | OROMUCOSAL | Status: DC | PRN

## 2015-02-04 MED ORDER — ZOLPIDEM TARTRATE 5 MG PO TABS: 5.0000 mg | ORAL_TABLET | Freq: Every evening | ORAL | Status: DC | PRN

## 2015-02-04 MED ORDER — OXYCODONE-ACETAMINOPHEN 5-325 MG PO TABS: 2.0000 | ORAL_TABLET | ORAL | Status: DC | PRN

## 2015-02-04 MED ORDER — SENNOSIDES-DOCUSATE SODIUM 8.6-50 MG PO TABS
2.0000 | ORAL_TABLET | ORAL | Status: DC
  Administered 2015-02-05 (×2): 2 via ORAL
  Filled 2015-02-04 (×2): qty 2

## 2015-02-04 MED ORDER — DIPHENHYDRAMINE HCL 25 MG PO CAPS: 25.0000 mg | ORAL_CAPSULE | Freq: Four times a day (QID) | ORAL | Status: DC | PRN

## 2015-02-04 MED ORDER — OXYTOCIN 40 UNITS IN LACTATED RINGERS INFUSION - SIMPLE MED: 62.5000 mL/h | INTRAVENOUS | Status: AC

## 2015-02-04 MED ORDER — IBUPROFEN 600 MG PO TABS
600.0000 mg | ORAL_TABLET | Freq: Four times a day (QID) | ORAL | Status: DC
  Administered 2015-02-04 – 2015-02-06 (×9): 600 mg via ORAL
  Filled 2015-02-04 (×9): qty 1

## 2015-02-04 MED ORDER — KETOROLAC TROMETHAMINE 30 MG/ML IJ SOLN
INTRAMUSCULAR | Status: AC
  Filled 2015-02-04: qty 1

## 2015-02-04 MED ORDER — ACETAMINOPHEN 325 MG PO TABS
650.0000 mg | ORAL_TABLET | ORAL | Status: DC | PRN
  Administered 2015-02-04 – 2015-02-05 (×3): 650 mg via ORAL
  Filled 2015-02-04 (×3): qty 2

## 2015-02-04 MED ORDER — OXYCODONE-ACETAMINOPHEN 5-325 MG PO TABS
1.0000 | ORAL_TABLET | ORAL | Status: DC | PRN
  Administered 2015-02-05: 1 via ORAL
  Filled 2015-02-04: qty 1

## 2015-02-04 MED ORDER — TETANUS-DIPHTH-ACELL PERTUSSIS 5-2.5-18.5 LF-MCG/0.5 IM SUSP: 0.5000 mL | Freq: Once | INTRAMUSCULAR | Status: DC

## 2015-02-04 MED ORDER — MEASLES, MUMPS & RUBELLA VAC ~~LOC~~ INJ
0.5000 mL | INJECTION | Freq: Once | SUBCUTANEOUS | Status: DC
  Filled 2015-02-04: qty 0.5

## 2015-02-04 MED ORDER — SIMETHICONE 80 MG PO CHEW
80.0000 mg | CHEWABLE_TABLET | ORAL | Status: DC
  Administered 2015-02-05 (×2): 80 mg via ORAL
  Filled 2015-02-04 (×2): qty 1

## 2015-02-04 MED ORDER — SIMETHICONE 80 MG PO CHEW
80.0000 mg | CHEWABLE_TABLET | Freq: Three times a day (TID) | ORAL | Status: DC
  Administered 2015-02-04 – 2015-02-06 (×7): 80 mg via ORAL
  Filled 2015-02-04 (×7): qty 1

## 2015-02-04 MED ORDER — DIBUCAINE 1 % RE OINT: 1.0000 "application " | TOPICAL_OINTMENT | RECTAL | Status: DC | PRN

## 2015-02-04 MED ORDER — BISACODYL 10 MG RE SUPP: 10.0000 mg | Freq: Every day | RECTAL | Status: DC | PRN

## 2015-02-04 MED ORDER — LANOLIN HYDROUS EX OINT: 1.0000 "application " | TOPICAL_OINTMENT | CUTANEOUS | Status: DC | PRN

## 2015-02-04 MED ORDER — LACTATED RINGERS IV SOLN
INTRAVENOUS | Status: DC
  Administered 2015-02-04: 06:00:00 via INTRAVENOUS

## 2015-02-04 MED ORDER — FLEET ENEMA 7-19 GM/118ML RE ENEM: 1.0000 | ENEMA | Freq: Every day | RECTAL | Status: DC | PRN

## 2015-02-04 NOTE — Anesthesia Postprocedure Evaluation (Signed)
  Anesthesia Post-op Note  Patient: Holly Glass  Procedure(s) Performed: Procedure(s): CESAREAN SECTION (N/A)  Patient Location: Mother/Baby  Anesthesia Type:Spinal  Level of Consciousness: awake, alert , oriented and patient cooperative  Airway and Oxygen Therapy: Patient Spontanous Breathing  Post-op Pain: none  Post-op Assessment: Post-op Vital signs reviewed, Patient's Cardiovascular Status Stable, Respiratory Function Stable, Patent Airway, No headache, No backache and Patient able to bend at knees              Post-op Vital Signs: Reviewed and stable  Last Vitals:  Filed Vitals:   02/04/15 0430  BP: 133/67  Pulse: 86  Temp:   Resp:     Complications: No apparent anesthesia complications

## 2015-02-04 NOTE — Lactation Note (Signed)
This note was copied from the chart of Holly Jeena Schmierer. Lactation Consultation Note  Patient Name: Holly Glass [id number]'s Date: 02/04/2015 Reason for consult: Initial assessment   Initial consult  With first time mom of 17 hour old infant. Infant born via C/S at 41w 5d at 7 lb 13.4 oz.. Infant with 3 BF for 15-25 minutes, 1 attempt, 1 void and 5 stools since birth. Infant asleep STS with mom. Mom reports she feels like infant has been sleepy. She is using awakening techniques as needed. BF Basics reviewed with family. Enc mom to feed 8-12 x in 24 hours and to awaken at 3 hours if infant not showing cues. Practice STS when infant wont feed. Discussed BF information in Taking Care of Baby and Me. [facility] and is to make an appointment at [facility]C. Mom has been taught hand expression and is able to express drops of colostrum. LC Brochure given, discussed IP Services, OP Services, Support Groups, Phone # and [employer] handout. Enc mom to call with questions/concerns or feeding assistance.    Maternal Data Formula Feeding for Exclusion: No Has patient been taught Hand Expression?: Yes Does the patient have breastfeeding experience prior to this delivery?: No  Feeding Length of feed: 15 min  LATCH Score/Interventions Latch:  (feeding not observed)                    Lactation Tools Discussed/Used WIC Program: Yes   Consult Status Consult Status: Follow-up Date: 02/05/15 Follow-up type: In-patient    Silas FloodSharon S Brenleigh Collet 02/04/2015, 4:34 PM

## 2015-02-04 NOTE — Progress Notes (Signed)
Patient experienced nausea and vomiting when sat up on edge of bed to do orthostatics and dangle patient.  Patient helped back into bed and nausea med administered

## 2015-02-04 NOTE — Progress Notes (Addendum)
Holly HelperChelsea D Glass [id number]  Subjective: Postpartum Day 1: Primary LTC/S due to NRFHR, oligo noted on BPP Patient had some vomiting during night, but none since 3am.  Tolerating po fluids without difficulty, and no nausea this am.  Has not been up yet, but denies any dizziness with sitting up or position change. Feeding:  Breast Contraceptive plan:  Declines at present.  Family plans outpatient circumcision.  Objective: Temp:  [97.6 F (36.4 C)-99.4 F (37.4 C)] 97.6 F (36.4 C) (11/06 0425) Pulse Rate:  [64-100] 86 (11/06 0430) Resp:  [10-20] 18 (11/06 0425) BP: (111-139)/(64-89) 133/67 mmHg (11/06 0430) SpO2:  [90 %-100 %] 98 % (11/06 0425)  CBC Latest Ref Rng 02/04/2015 02/03/2015  WBC 4.0 - 10.5 K/uL 17.4(H) 10.5  Hemoglobin 12.0 - 15.0 g/dL 4.0(J8.8(L) 10.2(L)  Hematocrit 36.0 - 46.0 % 28.5(L) 33.2(L)  Platelets 150 - 400 K/uL 226 247     Physical Exam:  General: alert Lochia: appropriate Uterine Fundus: firm Abdomen:  + bowel sounds Incision: Honeycomb dressing CDI DVT Evaluation: No evidence of DVT seen on physical exam. Negative Homan's sign. Foley draining clear urine.   Assessment/Plan: Status post cesarean delivery, day 1. Anemia--chronic and acute blood loss Stable Continue current care. Orthostatic VS Fe BID Recheck CBC tomorrow. Planning ambulation this am   Holly Glass, Holly Obrecht MSN, CNM 02/04/2015, 8:23 AM

## 2015-02-04 NOTE — Addendum Note (Signed)
Addendum  created 02/04/15 0753 by Yolonda KidaAlison L Tytus Strahle, CRNA   Modules edited: Notes Section   Notes Section:  File: [id number]

## 2015-02-05 ENCOUNTER — Encounter (HOSPITAL_COMMUNITY): Payer: Self-pay | Admitting: Obstetrics and Gynecology

## 2015-02-05 NOTE — Lactation Note (Signed)
This note was copied from the chart of Holly Kordelia Cardarelli. Lactation Consultation Note Follow up visit at 48 hours of age.  Baby is latched to left breast in football hold.  Encouraged mom to hold baby close for a deep latch with nose cheeks and chin up close to breast.  Baby is sleepy and needs stimulation to maintain feeding.  Encouraged mom to compress breast and keep baby awake and active for feedings. Mom denies other concerns at this time.  Mom to call night nurse for a LATCH score overnight.    Patient Name: Holly Glass [id number]'XToday's Date: 02/05/2015 Reason for consult: Follow-up assessment   Maternal Data Has patient been taught Hand Expression?: Yes  Feeding Feeding Type: Breast Fed Length of feed: 10 min  LATCH Score/Interventions Latch: Grasps breast easily, tongue down, lips flanged, rhythmical sucking. Intervention(s): Adjust position;Assist with latch;Breast massage;Breast compression  Audible Swallowing: A few with stimulation Intervention(s): Skin to skin;Hand expression  Type of Nipple: Everted at rest and after stimulation  Comfort (Breast/Nipple): Soft / non-tender     Hold (Positioning): Assistance needed to correctly position infant at breast and maintain latch. Intervention(s): Breastfeeding basics reviewed;Support Pillows;Position options;Skin to skin  LATCH Score: 8  Lactation Tools Discussed/Used     Consult Status Consult Status: Follow-up Date: 02/06/15 Follow-up type: In-patient    Jannifer RodneyShoptaw, Ramani Riva Lynn 02/05/2015, 11:20 PM

## 2015-02-05 NOTE — Progress Notes (Signed)
Holly Glass [id number]  Subjective: Postpartum Day 2: Primary LTC/S due to non reassuring fetal heart tones Patient up ad lib, reports no syncope or dizziness. Feeding:  Breast Contraceptive plan:  Undecided  Circumcision plan: outpatient   Received a call from the RN caring for the patient.   The patient was walking with her husband and fell.  Pt reports that she was uninjured.  States she waited an hour after taking a Percocet but felt dizzy.     Objective: Temp:  [98 F (36.7 C)-99 F (37.2 C)] 98.4 F (36.9 C) (11/07 0915) Pulse Rate:  [77-114] 77 (11/07 0915) Resp:  [16-18] 16 (11/07 0915) BP: (120-134)/(62-73) 120/64 mmHg (11/07 0915) SpO2:  [99 %-100 %] 100 % (11/07 0915)  CBC Latest Ref Rng 02/04/2015 02/03/2015  WBC 4.0 - 10.5 K/uL 17.4(H) 10.5  Hemoglobin 12.0 - 15.0 g/dL 4.0(J8.8(L) 10.2(L)  Hematocrit 36.0 - 46.0 % 28.5(L) 33.2(L)  Platelets 150 - 400 K/uL 226 247     Physical Exam:  General: alert and cooperative Lochia: appropriate Uterine Fundus: firm Abdomen:  + bowel sounds, NT Incision: Honeycomb dressing CDI DVT Evaluation: No evidence of DVT seen on physical exam. No evidence of injury or bruising as a result of the reported fall  Assessment/Plan: Status post cesarean delivery, day 2. Stable Consider Fall precautions Continue current care. Plan for discharge tomorrow   Alphonzo SeveranceRachel Simone Rodenbeck MSN, CNM 02/05/2015, 9:39 AM

## 2015-02-05 NOTE — Progress Notes (Signed)
Tried 3 times to do safety zone portal it is down according to the website.

## 2015-02-05 NOTE — Progress Notes (Signed)
Holly Glass CNM notified of patient in [id number] [place] that she fell in the elevator. It was not witnessed by me but NICU RN witnessed it and stated she did not  Fall and hit her head or anything. She just trickled down to the floor husband was with her. Her BP was 120/64 pulse 77 after the fall. Patient was assesed and she stated she had no dizzyness post fall or blurred vision. She states that nothing is hurting her at all that she is fine. Will safety Zone and monitor status. No further orders.

## 2015-02-06 DIAGNOSIS — Z98891 History of uterine scar from previous surgery: Secondary | ICD-10-CM

## 2015-02-06 MED ORDER — SENNOSIDES-DOCUSATE SODIUM 8.6-50 MG PO TABS: 2.0000 | ORAL_TABLET | Freq: Every evening | ORAL | Status: DC | PRN

## 2015-02-06 MED ORDER — IBUPROFEN 600 MG PO TABS: 600.0000 mg | ORAL_TABLET | Freq: Four times a day (QID) | ORAL | Status: DC | PRN

## 2015-02-06 MED ORDER — FERROUS SULFATE 325 (65 FE) MG PO TABS: 325.0000 mg | ORAL_TABLET | Freq: Two times a day (BID) | ORAL | Status: DC

## 2015-02-06 NOTE — Lactation Note (Signed)
This note was copied from the chart of Holly Glass. Lactation Consultation Note  Patient Name: Holly Glass ZOXWR'UToday's Date: 02/06/2015 Reason for consult: Follow-up assessment Mom has been using football hold to latch and asked for assist with other positions. Assisted with latch in cross cradle. Baby demonstrated good suckling bursts with some swallows noted. Advised baby should be at the breast 8-12 times in 24 hours and with feeding ques. Engorgement care reviewed if needed. [name] given per Mom's request. Advised of OP services and support group. Advised to call for questions/concerns.   Maternal Data    Feeding Feeding Type: Breast Fed  LATCH Score/Interventions Latch: Grasps breast easily, tongue down, lips flanged, rhythmical sucking. Intervention(s): Adjust position;Assist with latch;Breast massage;Breast compression  Audible Swallowing: Spontaneous and intermittent  Type of Nipple: Everted at rest and after stimulation (short nipple shafts bilateral)  Comfort (Breast/Nipple): Soft / non-tender     Hold (Positioning): Assistance needed to correctly position infant at breast and maintain latch. Intervention(s): Breastfeeding basics reviewed;Support Pillows;Position options;Skin to skin  LATCH Score: 9  Lactation Tools Discussed/Used     Consult Status Consult Status: Complete Date: 02/06/15 Follow-up type: In-patient    Alfred LevinsGranger, Aster Screws Ann 02/06/2015, 12:29 PM

## 2015-02-06 NOTE — Discharge Summary (Signed)
OB Discharge Summary     Patient Name: Holly Glass DOB: Mar 12, 1991 MRN: [id number]  Date of admission: 02/03/2015 Delivering MD: Jaymes GraffILLARD, NAIMA   Date of discharge: 02/06/2015  Admitting diagnosis: 41WKS INDUCTION  Intrauterine pregnancy: 2767w5d     Secondary diagnosis:  Principal Problem:   Status post primary low transverse cesarean section Active Problems:   Post term pregnancy   BMI 40.0-44.9, adult (HCC)   Eczema  Additional problems: Fetal intolerance of induction process, Primary low transverse cesarean section due to non reassuring fetal heart tones.  Pt fell without injuries during course of her  Hospitalization.      Discharge diagnosis: Term Pregnancy Delivered                                                                                                Post partum procedures:none  Augmentation: Cytotec  Complications: None  Hospital course:  Induction of Labor With Cesarean Section  24 y.o. yo G1P1000 at 2267w5d was admitted to the hospital 02/03/2015 for induction of labor for post dates. Patient had a labor course significant for fetal intolrance of induction . The patient went for cesarean section due to Non-Reassuring FHR, and delivered a Viable infant,  Boy @BABYSUPPRESS (DBLINK,ept,110,,1,,) Membrane Rupture Time/Date: )10:21 PM ,02/03/2015   @Details  of operation can be found in separate operative Note.  Patient had an uncomplicated postpartum course. She is ambulating, tolerating a regular diet, passing flatus, and urinating well.  Patient is discharged home in stable condition on No discharge date for patient encounter.Marland Kitchen.                                     Physical exam  Filed Vitals:   02/05/15 0715 02/05/15 0915 02/05/15 1315 02/06/15 0600  BP: 121/72 120/64 123/62 138/79  Pulse: 86 77 89 77  Temp: 98 F (36.7 C) 98.4 F (36.9 C) 98.6 F (37 C) 98.3 F (36.8 C)  TempSrc:  Oral Oral Oral  Resp: 18 16 14 18   Height:      Weight:      SpO2:  100%  100%    General: alert and cooperative Lochia: appropriate Uterine Fundus: firm Incision: Healing well with no significant drainage, Clean dry and intact DVT Evaluation: No evidence of DVT seen on physical exam. Labs: Lab Results  Component Value Date   WBC 17.4* 02/04/2015   HGB 8.8* 02/04/2015   HCT 28.5* 02/04/2015   MCV 75.4* 02/04/2015   PLT 226 02/04/2015   No flowsheet data found.  Discharge instruction: per After Visit Summary and "Baby and Me Booklet".  After visit meds:    Medication List    TAKE these medications        ferrous sulfate 325 (65 FE) MG tablet  Take 1 tablet (325 mg total) by mouth 2 (two) times daily with a meal.     ibuprofen 600 MG tablet  Commonly known as:  ADVIL,MOTRIN  Take 1 tablet (600 mg total) by mouth every 6 (six) hours as  needed for mild pain.     prenatal vitamin w/FE, FA 27-1 MG Tabs tablet  Take 1 tablet by mouth daily at 12 noon.     senna-docusate 8.6-50 MG tablet  Commonly known as:  Senokot-S  Take 2 tablets by mouth at bedtime as needed for mild constipation.        Diet: routine diet  Activity: Advance as tolerated. Pelvic rest for 6 weeks.    Outpatient follow BM:[facility] ASAP to schedule outpatient circumcision Make an appointment in 6 weeks for postpartum visit. Follow up Appt:No future appointments. Follow up Visit:No Follow-up on file.  Postpartum contraception: Undecided  -  Pelvic rest and contraceptive precautions emphasized  Newborn Data: Live born female   Birth Weight: 7 lb 13.4 oz (3555 g) APGAR: 8, 9  Baby Feeding: Breast Disposition:home with mother   02/06/2015 Alphonzo Severance, CNM

## 2015-02-06 NOTE — Discharge Instructions (Signed)
Postpartum Care After Cesarean Delivery After you deliver your newborn (postpartum period), the usual stay in the hospital is 24-72 hours. If there were problems with your labor or delivery, or if you have other medical problems, you might be in the hospital longer.  While you are in the hospital, you will receive help and instructions on how to care for yourself and your newborn during the postpartum period.  While you are in the hospital:  It is normal for you to have pain or discomfort from the incision in your abdomen. Be sure to tell your nurses when you are having pain, where the pain is located, and what makes the pain worse.  If you are breastfeeding, you may feel uncomfortable contractions of your uterus for a couple of weeks. This is normal. The contractions help your uterus get back to normal size.  It is normal to have some bleeding after delivery.  For the first 1-3 days after delivery, the flow is red and the amount may be similar to a period.  It is common for the flow to start and stop.  In the first few days, you may pass some small clots. Let your nurses know if you begin to pass large clots or your flow increases.  Do not  flush blood clots down the toilet before having the nurse look at them.  During the next 3-10 days after delivery, your flow should become more watery and pink or brown-tinged in color.  Ten to fourteen days after delivery, your flow should be a small amount of yellowish-white discharge.  The amount of your flow will decrease over the first few weeks after delivery. Your flow may stop in 6-8 weeks. Most women have had their flow stop by 12 weeks after delivery.  You should change your sanitary pads frequently.  Wash your hands thoroughly with soap and water for at least 20 seconds after changing pads, using the toilet, or before holding or feeding your newborn.  Your intravenous (IV) tubing will be removed when you are drinking enough fluids.  The  urine drainage tube (urinary catheter) that was inserted before delivery may be removed within 6-8 hours after delivery or when feeling returns to your legs. You should feel like you need to empty your bladder within the first 6-8 hours after the catheter has been removed.  In case you become weak, lightheaded, or faint, call your nurse before you get out of bed for the first time and before you take a shower for the first time.  Within the first few days after delivery, your breasts may begin to feel tender and full. This is called engorgement. Breast tenderness usually goes away within 48-72 hours after engorgement occurs. You may also notice milk leaking from your breasts. If you are not breastfeeding, do not stimulate your breasts. Breast stimulation can make your breasts produce more milk.  Spending as much time as possible with your newborn is very important. During this time, you and your newborn can feel close and get to know each other. Having your newborn stay in your room (rooming in) will help to strengthen the bond with your newborn. It will give you time to get to know your newborn and become comfortable caring for your newborn.  Your hormones change after delivery. Sometimes the hormone changes can temporarily cause you to feel sad or tearful. These feelings should not last more than a few days. If these feelings last longer than that, you should talk to your  caregiver. °· If desired, talk to your caregiver about methods of family planning or contraception. °· Talk to your caregiver about immunizations. Your caregiver may want you to have the following immunizations before leaving the hospital: °· Tetanus, diphtheria, and pertussis (Tdap) or tetanus and diphtheria (Td) immunization. It is very important that you and your family (including grandparents) or others caring for your newborn are up-to-date with the Tdap or Td immunizations. The Tdap or Td immunization can help protect your newborn  from getting ill. °· Rubella immunization. °· Varicella (chickenpox) immunization. °· Influenza immunization. You should receive this annual immunization if you did not receive the immunization during your pregnancy. °  °This information is not intended to replace advice given to you by your health care provider. Make sure you discuss any questions you have with your health care provider. °  °Document Released: 12/10/2011 Document Reviewed: 12/10/2011 °Elsevier Interactive Patient Education ©2016 [employer] ° °Iron-Rich Diet °Iron is a mineral that helps your body to produce hemoglobin. Hemoglobin is a protein in your red blood cells that carries oxygen to your body's tissues. Eating too little iron may cause you to feel weak and tired, and it can increase your risk for infection. Eating enough iron is necessary for your body's metabolism, muscle function, and nervous system. °Iron is naturally found in many foods. It can also be added to foods or fortified in foods. There are two types of dietary iron: °· Heme iron. Heme iron is absorbed by the body more easily than nonheme iron. Heme iron is found in meat, poultry, and fish. °· Nonheme iron. Nonheme iron is found in dietary supplements, iron-fortified grains, beans, and vegetables. °You may need to follow an iron-rich diet if: °· You have been diagnosed with iron deficiency or iron-deficiency anemia. °· You have a condition that prevents you from absorbing dietary iron, such as: °¨ Infection in your intestines. °¨ Celiac disease. This involves long-lasting (chronic) inflammation of your intestines. °· You do not eat enough iron. °· You eat a diet that is high in foods that impair iron absorption. °· You have lost a lot of blood. °· You have heavy bleeding during your menstrual cycle. °· You are pregnant. °WHAT IS MY PLAN? °Your health care provider may help you to determine how much iron you need per day based on your condition. Generally, when a person  consumes sufficient amounts of iron in the diet, the following iron needs are met: °· Men. °¨ 14-18 years old: 11 mg per day. °¨ 19-50 years old: 8 mg per day. °· Women.   °¨ 14-18 years old: 15 mg per day. °¨ 19-50 years old: 18 mg per day. °¨ Over 50 years old: 8 mg per day. °¨ Pregnant women: 27 mg per day. °¨ Breastfeeding women: 9 mg per day. °WHAT DO I NEED TO KNOW ABOUT AN IRON-RICH DIET? °· Eat fresh fruits and vegetables that are high in vitamin C along with foods that are high in iron. This will help increase the amount of iron that your body absorbs from food, especially with foods containing nonheme iron. Foods that are high in vitamin C include oranges, peppers, tomatoes, and mango. °· Take iron supplements only as directed by your health care provider. Overdose of iron can be life-threatening. If you were prescribed iron supplements, take them with orange juice or a vitamin C supplement. °· Cook foods in pots and pans that are made from iron.   °· Eat nonheme iron-containing foods alongside foods that   are high in heme iron. This helps to improve your iron absorption.   °· Certain foods and drinks contain compounds that impair iron absorption. Avoid eating these foods in the same meal as iron-rich foods or with iron supplements. These include: °¨ Coffee, black tea, and red wine. °¨ Milk, dairy products, and foods that are high in calcium. °¨ Beans, soybeans, and peas. °¨ Whole grains. °· When eating foods that contain both nonheme iron and compounds that impair iron absorption, follow these tips to absorb iron better.   °¨ Soak beans overnight before cooking. °¨ Soak whole grains overnight and drain them before using. °¨ Ferment flours before baking, such as using yeast in bread dough. °WHAT FOODS CAN I EAT? °Grains  °Iron-fortified breakfast cereal. Iron-fortified whole-wheat bread. Enriched rice. Sprouted grains. °Vegetables  °Spinach. Potatoes with skin. Green peas. Broccoli. Red and green bell  peppers. Fermented vegetables. °Fruits  °Prunes. Raisins. Oranges. Strawberries. Mango. Grapefruit. °Meats and Other Protein Sources  °Beef liver. Oysters. Beef. Shrimp. Turkey. Chicken. Tuna. Sardines. Chickpeas. Nuts. Tofu. °Beverages  °Tomato juice. Fresh orange juice. Prune juice. Hibiscus tea. Fortified instant breakfast shakes. °Condiments  °Tahini. Fermented soy sauce.  °Sweets and Desserts  °Black-strap molasses.  °Other  °Wheat germ. °The items listed above may not be a complete list of recommended foods or beverages. Contact your dietitian for more options.  °WHAT FOODS ARE NOT RECOMMENDED? °Grains  °Whole grains. Bran cereal. Bran flour. Oats. °Vegetables  °Artichokes. Brussels sprouts. Kale. °Fruits  °Blueberries. Raspberries. Strawberries. Figs. °Meats and Other Protein Sources  °Soybeans. Products made from soy protein. °Dairy  °Milk. Cream. Cheese. Yogurt. Cottage cheese. °Beverages  °Coffee. Black tea. Red wine. °Sweets and Desserts  °Cocoa. Chocolate. Ice cream. °Other  °Basil. Oregano. Parsley. °The items listed above may not be a complete list of foods and beverages to avoid. Contact your dietitian for more information.  °  °This information is not intended to replace advice given to you by your health care provider. Make sure you discuss any questions you have with your health care provider. °  °Document Released: 10/29/2004 Document Revised: 04/07/2014 Document Reviewed: 10/12/2013 °Elsevier Interactive Patient Education ©2016 [employer] Inc. ° °

## 2015-02-12 ENCOUNTER — Encounter (HOSPITAL_COMMUNITY): Payer: Self-pay

## 2015-12-17 ENCOUNTER — Inpatient Hospital Stay (HOSPITAL_COMMUNITY)
Admission: AD | Admit: 2015-12-17 | Discharge: 2015-12-17 | Disposition: A | Discharge status: Home / Self Care | Payer: Medicaid Other | Attending: Family Medicine | Admitting: Family Medicine

## 2015-12-17 DIAGNOSIS — N912 Amenorrhea, unspecified: Secondary | ICD-10-CM | POA: Insufficient documentation

## 2015-12-17 NOTE — MAU Note (Addendum)
Pt states that she had a +upt at home 3 days ago and needs a pregnancy verification letter. LMP: end of July/early August. Pt denies pain or bleeding. Is still currently breastfeeding 6610 month old son.

## 2015-12-17 NOTE — MAU Provider Note (Signed)
S:  Holly Glass is a 25 y.o. G1P1001 at Unknown wks here for confirmation of pregnancy.  Patient's No LMP recorded (lmp unknown)..  Denies any vaginal bleeding or abdominal pain.  Had positive HPT 3 days ago. States she needs a pregnancy verification letter.  O:  Past Medical History:  Diagnosis Date  . Eczema   . Medical history non-contributory     Family History  Problem Relation Age of Onset  . Hypertension Mother   . Hypertension Father   . Arthritis Maternal Grandmother   . Heart disease Maternal Grandmother   . Miscarriages / Stillbirths Maternal Grandmother   . Cancer Maternal Grandfather   . Alcohol abuse Paternal Grandmother   . Heart murmur Sister   . Birth defects Maternal Uncle     short leg     Vitals:   12/17/15 1907  BP: 128/71  Pulse: 78  Resp: 16  Temp: 98 F (36.7 C)   General:  A&OX3 with no signs of acute distress. She appears well-developed and well-nourished. No distress.  Neck: Normal range of motion.  Pulmonary/Chest: Effort normal. No respiratory distress.  Musculoskeletal: Normal range of motion.  Neurological: She is alert and oriented to person, place, and time.  Skin: Skin is warm and dry.   A: Amenorrhea  P: Discussed that we do not do pregnancy verifications in the MAU. [facility] during office hours.   Judeth HornErin Sheron Tallman, NP

## 2016-01-16 ENCOUNTER — Other Ambulatory Visit (HOSPITAL_COMMUNITY)
Admission: RE | Admit: 2016-01-16 | Discharge: 2016-01-16 | Disposition: A | Discharge status: Home / Self Care | Payer: Medicaid Other | Source: Ambulatory Visit | Attending: Obstetrics and Gynecology | Admitting: Obstetrics and Gynecology

## 2016-01-16 ENCOUNTER — Ambulatory Visit (INDEPENDENT_AMBULATORY_CARE_PROVIDER_SITE_OTHER): Payer: Medicaid Other | Admitting: Obstetrics and Gynecology

## 2016-01-16 ENCOUNTER — Encounter: Payer: Self-pay | Admitting: Obstetrics and Gynecology

## 2016-01-16 VITALS — BP 132/85 | HR 80 | Wt 185.0 lb

## 2016-01-16 DIAGNOSIS — Z113 Encounter for screening for infections with a predominantly sexual mode of transmission: Secondary | ICD-10-CM | POA: Insufficient documentation

## 2016-01-16 DIAGNOSIS — Z98891 History of uterine scar from previous surgery: Secondary | ICD-10-CM | POA: Insufficient documentation

## 2016-01-16 DIAGNOSIS — O34219 Maternal care for unspecified type scar from previous cesarean delivery: Secondary | ICD-10-CM

## 2016-01-16 DIAGNOSIS — Z3491 Encounter for supervision of normal pregnancy, unspecified, first trimester: Secondary | ICD-10-CM | POA: Diagnosis not present

## 2016-01-16 DIAGNOSIS — Z3490 Encounter for supervision of normal pregnancy, unspecified, unspecified trimester: Secondary | ICD-10-CM | POA: Insufficient documentation

## 2016-01-16 DIAGNOSIS — Z01419 Encounter for gynecological examination (general) (routine) without abnormal findings: Secondary | ICD-10-CM | POA: Diagnosis not present

## 2016-01-16 LAB — POCT URINE PREGNANCY: PREG TEST UR: POSITIVE — AB

## 2016-01-16 NOTE — Addendum Note (Signed)
Addended by: Hermina StaggersERVIN, Tionne Carelli L on: 01/16/2016 11:55 AM   Modules accepted: Orders

## 2016-01-16 NOTE — Addendum Note (Signed)
Addended by: Marya Landry D on: 01/16/2016 12:11 PM   Modules accepted: Orders

## 2016-01-16 NOTE — Addendum Note (Signed)
Addended by: Marya LandryFOSTER, Kennidee Heyne D on: 01/16/2016 03:23 PM   Modules accepted: Orders

## 2016-01-16 NOTE — Progress Notes (Signed)
Subjective:  Holly Glass is a 25 y.o. G2P1001 at 3168w3d being seen today for her first OB visit. LMP was the end of July. Does not recall the exact date. She has no chronic medical problems. Last pregnancy was unremarkable. C section due to arrest of descent and dilatation.  She is currently monitored for the following issues for this low-risk pregnancy and has Eczema; Supervision of normal pregnancy, antepartum; and H/O cesarean section on her problem list.  Patient reports no complaints.  Contractions: Not present. Vag. Bleeding: None.   . Denies leaking of fluid.   The following portions of the patient's history were reviewed and updated as appropriate: allergies, current medications, past family history, past medical history, past social history, past surgical history and problem list. Problem list updated.  Objective:   Vitals:   01/16/16 1100  BP: 132/85  Pulse: 80  Weight: 185 lb (83.9 kg)    Fetal Status:           General:  Alert, oriented and cooperative. Patient is in no acute distress.  Skin: Skin is warm and dry. No rash noted.   Cardiovascular: Normal heart rate noted  Respiratory: Normal respiratory effort, no problems with respiration noted  Abdomen: Soft, gravid, appropriate for gestational age. Pain/Pressure: Absent     Pelvic:  Cervical exam performed        Extremities: Normal range of motion.     Mental Status: Normal mood and affect. Normal behavior. Normal judgment and thought content.   Urinalysis: Urine Protein: Negative Urine Glucose: Negative  Assessment and Plan:  Pregnancy: G2P1001 at 7368w3d  1. H/O cesarean section Declines flu vaccine - US OB Comp + 14 Wk; Future - HIV antibody - Hemoglobinopathy evaluation - Varicella zoster antibody, IgG - Prenatal Profile I - Culture, OB Urine - ToxASSURE Select 13 (MW), Urine - Cytology - PAP - GC/Chlamydia probe amp (Major[facility] - US MFM OB Transvaginal; Future  2. Encounter for supervision  of normal pregnancy, antepartum, unspecified gravidity   Preterm labor symptoms and general obstetric precautions including but not limited to vaginal bleeding, contractions, leaking of fluid and fetal movement were reviewed in detail with the patient. Please refer to After Visit Summary for other counseling recommendations.  No Follow-up on file.   Hermina StaggersMichael L Tyshell Ramberg, MD

## 2016-01-16 NOTE — Patient Instructions (Signed)
First Trimester of Pregnancy The first trimester of pregnancy is from week 1 until the end of week 12 (months 1 through 3). A week after a sperm fertilizes an egg, the egg will implant on the wall of the uterus. This embryo will begin to develop into a baby. Genes from you and your partner are forming the baby. The female genes determine whether the baby is a boy or a girl. At 6-8 weeks, the eyes and face are formed, and the heartbeat can be seen on ultrasound. At the end of 12 weeks, all the baby's organs are formed.  Now that you are pregnant, you will want to do everything you can to have a healthy baby. Two of the most important things are to get good prenatal care and to follow your health care provider's instructions. Prenatal care is all the medical care you receive before the baby's birth. This care will help prevent, find, and treat any problems during the pregnancy and childbirth. BODY CHANGES Your body goes through many changes during pregnancy. The changes vary from woman to woman.   You may gain or lose a couple of pounds at first.  You may feel sick to your stomach (nauseous) and throw up (vomit). If the vomiting is uncontrollable, call your health care provider.  You may tire easily.  You may develop headaches that can be relieved by medicines approved by your health care provider.  You may urinate more often. Painful urination may mean you have a bladder infection.  You may develop heartburn as a result of your pregnancy.  You may develop constipation because certain hormones are causing the muscles that push waste through your intestines to slow down.  You may develop hemorrhoids or swollen, bulging veins (varicose veins).  Your breasts may begin to grow larger and become tender. Your nipples may stick out more, and the tissue that surrounds them (areola) may become darker.  Your gums may bleed and may be sensitive to brushing and flossing.  Dark spots or blotches (chloasma,  mask of pregnancy) may develop on your face. This will likely fade after the baby is born.  Your menstrual periods will stop.  You may have a loss of appetite.  You may develop cravings for certain kinds of food.  You may have changes in your emotions from day to day, such as being excited to be pregnant or being concerned that something may go wrong with the pregnancy and baby.  You may have more vivid and strange dreams.  You may have changes in your hair. These can include thickening of your hair, rapid growth, and changes in texture. Some women also have hair loss during or after pregnancy, or hair that feels dry or thin. Your hair will most likely return to normal after your baby is born. WHAT TO EXPECT AT YOUR PRENATAL VISITS During a routine prenatal visit:  You will be weighed to make sure you and the baby are growing normally.  Your blood pressure will be taken.  Your abdomen will be measured to track your baby's growth.  The fetal heartbeat will be listened to starting around week 10 or 12 of your pregnancy.  Test results from any previous visits will be discussed. Your health care provider may ask you:  How you are feeling.  If you are feeling the baby move.  If you have had any abnormal symptoms, such as leaking fluid, bleeding, severe headaches, or abdominal cramping.  If you are using any tobacco products,   including cigarettes, chewing tobacco, and electronic cigarettes.  If you have any questions. Other tests that may be performed during your first trimester include:  Blood tests to find your blood type and to check for the presence of any previous infections. They will also be used to check for low iron levels (anemia) and Rh antibodies. Later in the pregnancy, blood tests for diabetes will be done along with other tests if problems develop.  Urine tests to check for infections, diabetes, or protein in the urine.  An ultrasound to confirm the proper growth  and development of the baby.  An amniocentesis to check for possible genetic problems.  Fetal screens for spina bifida and Down syndrome.  You may need other tests to make sure you and the baby are doing well.  HIV (human immunodeficiency virus) testing. Routine prenatal testing includes screening for HIV, unless you choose not to have this test. HOME CARE INSTRUCTIONS  Medicines  Follow your health care provider's instructions regarding medicine use. Specific medicines may be either safe or unsafe to take during pregnancy.  Take your prenatal vitamins as directed.  If you develop constipation, try taking a stool softener if your health care provider approves. Diet  Eat regular, well-balanced meals. Choose a variety of foods, such as meat or vegetable-based protein, fish, milk and low-fat dairy products, vegetables, fruits, and whole grain breads and cereals. Your health care provider will help you determine the amount of weight gain that is right for you.  Avoid raw meat and uncooked cheese. These carry germs that can cause birth defects in the baby.  Eating four or five small meals rather than three large meals a day may help relieve nausea and vomiting. If you start to feel nauseous, eating a few soda crackers can be helpful. Drinking liquids between meals instead of during meals also seems to help nausea and vomiting.  If you develop constipation, eat more high-fiber foods, such as fresh vegetables or fruit and whole grains. Drink enough fluids to keep your urine clear or pale yellow. Activity and Exercise  Exercise only as directed by your health care provider. Exercising will help you:  Control your weight.  Stay in shape.  Be prepared for labor and delivery.  Experiencing pain or cramping in the lower abdomen or low back is a good sign that you should stop exercising. Check with your health care provider before continuing normal exercises.  Try to avoid standing for long  periods of time. Move your legs often if you must stand in one place for a long time.  Avoid heavy lifting.  Wear low-heeled shoes, and practice good posture.  You may continue to have sex unless your health care provider directs you otherwise. Relief of Pain or Discomfort  Wear a good support bra for breast tenderness.   Take warm sitz baths to soothe any pain or discomfort caused by hemorrhoids. Use hemorrhoid cream if your health care provider approves.   Rest with your legs elevated if you have leg cramps or low back pain.  If you develop varicose veins in your legs, wear support hose. Elevate your feet for 15 minutes, 3-4 times a day. Limit salt in your diet. Prenatal Care  Schedule your prenatal visits by the twelfth week of pregnancy. They are usually scheduled monthly at first, then more often in the last 2 months before delivery.  Write down your questions. Take them to your prenatal visits.  Keep all your prenatal visits as directed by your   health care provider. Safety  Wear your seat belt at all times when driving.  Make a list of emergency phone numbers, including numbers for family, friends, the hospital, and police and fire departments. General Tips  Ask your health care provider for a referral to a local prenatal education class. Begin classes no later than at the beginning of month 6 of your pregnancy.  Ask for help if you have counseling or nutritional needs during pregnancy. Your health care provider can offer advice or refer you to specialists for help with various needs.  Do not use hot tubs, steam rooms, or saunas.  Do not douche or use tampons or scented sanitary pads.  Do not cross your legs for long periods of time.  Avoid cat litter boxes and soil used by cats. These carry germs that can cause birth defects in the baby and possibly loss of the fetus by miscarriage or stillbirth.  Avoid all smoking, herbs, alcohol, and medicines not prescribed by  your health care provider. Chemicals in these affect the formation and growth of the baby.  Do not use any tobacco products, including cigarettes, chewing tobacco, and electronic cigarettes. If you need help quitting, ask your health care provider. You may receive counseling support and other resources to help you quit.  Schedule a dentist appointment. At home, brush your teeth with a soft toothbrush and be gentle when you floss. SEEK MEDICAL CARE IF:   You have dizziness.  You have mild pelvic cramps, pelvic pressure, or nagging pain in the abdominal area.  You have persistent nausea, vomiting, or diarrhea.  You have a bad smelling vaginal discharge.  You have pain with urination.  You notice increased swelling in your face, hands, legs, or ankles. SEEK IMMEDIATE MEDICAL CARE IF:   You have a fever.  You are leaking fluid from your vagina.  You have spotting or bleeding from your vagina.  You have severe abdominal cramping or pain.  You have rapid weight gain or loss.  You vomit blood or material that looks like coffee grounds.  You are exposed to German measles and have never had them.  You are exposed to fifth disease or chickenpox.  You develop a severe headache.  You have shortness of breath.  You have any kind of trauma, such as from a fall or a car accident.   This information is not intended to replace advice given to you by your health care provider. Make sure you discuss any questions you have with your health care provider.   Document Released: 03/11/2001 Document Revised: 04/07/2014 Document Reviewed: 01/25/2013 Elsevier Interactive Patient Education 2016 [employer]  

## 2016-01-17 LAB — CYTOLOGY - PAP
ADEQUACY: ABSENT
Diagnosis: NEGATIVE

## 2016-01-18 LAB — CERVICOVAGINAL ANCILLARY ONLY
CHLAMYDIA, DNA PROBE: NEGATIVE
NEISSERIA GONORRHEA: NEGATIVE

## 2016-01-18 LAB — URINE CULTURE, OB REFLEX

## 2016-01-18 LAB — CULTURE, OB URINE

## 2016-01-21 ENCOUNTER — Ambulatory Visit (HOSPITAL_COMMUNITY)
Admission: RE | Admit: 2016-01-21 | Discharge: 2016-01-21 | Disposition: A | Discharge status: Home / Self Care | Payer: Medicaid Other | Source: Ambulatory Visit | Attending: Obstetrics and Gynecology | Admitting: Obstetrics and Gynecology

## 2016-01-21 ENCOUNTER — Other Ambulatory Visit: Payer: Self-pay | Admitting: *Deleted

## 2016-01-21 DIAGNOSIS — Z362 Encounter for other antenatal screening follow-up: Secondary | ICD-10-CM | POA: Diagnosis not present

## 2016-01-21 DIAGNOSIS — B379 Candidiasis, unspecified: Secondary | ICD-10-CM

## 2016-01-21 DIAGNOSIS — Z3490 Encounter for supervision of normal pregnancy, unspecified, unspecified trimester: Secondary | ICD-10-CM

## 2016-01-21 DIAGNOSIS — Z3A13 13 weeks gestation of pregnancy: Secondary | ICD-10-CM | POA: Diagnosis not present

## 2016-01-21 MED ORDER — TERCONAZOLE 0.4 % VA CREA: 1.0000 | TOPICAL_CREAM | Freq: Every day | VAGINAL | 0 refills | Status: DC

## 2016-01-22 ENCOUNTER — Other Ambulatory Visit: Payer: Self-pay

## 2016-01-22 MED ORDER — TERCONAZOLE 0.4 % VA CREA: 1.0000 | TOPICAL_CREAM | Freq: Every day | VAGINAL | 0 refills | Status: DC

## 2016-01-23 LAB — PRENATAL PROFILE I(LABCORP)
Antibody Screen: NEGATIVE
BASOS ABS: 0 10*3/uL (ref 0.0–0.2)
Basos: 0 %
EOS (ABSOLUTE): 0.3 10*3/uL (ref 0.0–0.4)
EOS: 3 %
Hematocrit: 38.8 % (ref 34.0–46.6)
Hemoglobin: 12.3 g/dL (ref 11.1–15.9)
Hepatitis B Surface Ag: NEGATIVE
IMMATURE GRANULOCYTES: 0 %
Immature Grans (Abs): 0 10*3/uL (ref 0.0–0.1)
LYMPHS ABS: 2.6 10*3/uL (ref 0.7–3.1)
Lymphs: 26 %
MCH: 27.1 pg (ref 26.6–33.0)
MCHC: 31.7 g/dL (ref 31.5–35.7)
MCV: 86 fL (ref 79–97)
MONOCYTES: 10 %
Monocytes Absolute: 1 10*3/uL — ABNORMAL HIGH (ref 0.1–0.9)
NEUTROS ABS: 6.1 10*3/uL (ref 1.4–7.0)
NEUTROS PCT: 61 %
PLATELETS: 298 10*3/uL (ref 150–379)
RBC: 4.54 x10E6/uL (ref 3.77–5.28)
RDW: 14.2 % (ref 12.3–15.4)
RH TYPE: POSITIVE
RPR Ser Ql: NONREACTIVE
Rubella Antibodies, IGG: 1.23 index (ref >0.99)
WBC: 10.1 10*3/uL (ref 3.4–10.8)

## 2016-01-23 LAB — HEMOGLOBINOPATHY EVALUATION
HGB A: 97.7 % (ref 94.0–98.0)
HGB C: 0 %
HGB S: 0 %
Hemoglobin A2 Quantitation: 2.3 % (ref 0.7–3.1)
Hemoglobin F Quantitation: 0 % (ref 0.0–2.0)

## 2016-01-23 LAB — VARICELLA ZOSTER ANTIBODY, IGG: VARICELLA: 3242 {index} (ref >165)

## 2016-01-23 LAB — TOXASSURE SELECT 13 (MW), URINE

## 2016-01-23 LAB — HIV ANTIBODY (ROUTINE TESTING W REFLEX): HIV Screen 4th Generation wRfx: NONREACTIVE

## 2016-02-14 ENCOUNTER — Encounter: Payer: Self-pay | Admitting: Obstetrics & Gynecology

## 2016-02-26 ENCOUNTER — Ambulatory Visit (INDEPENDENT_AMBULATORY_CARE_PROVIDER_SITE_OTHER): Payer: Self-pay | Admitting: Obstetrics & Gynecology

## 2016-02-26 DIAGNOSIS — Z3492 Encounter for supervision of normal pregnancy, unspecified, second trimester: Secondary | ICD-10-CM

## 2016-02-26 DIAGNOSIS — Z3490 Encounter for supervision of normal pregnancy, unspecified, unspecified trimester: Secondary | ICD-10-CM

## 2016-02-26 MED ORDER — FLUCONAZOLE 150 MG PO TABS: 150.0000 mg | ORAL_TABLET | Freq: Once | ORAL | 0 refills | Status: AC

## 2016-02-26 MED ORDER — TERCONAZOLE 0.4 % VA CREA: 1.0000 | TOPICAL_CREAM | Freq: Every day | VAGINAL | 0 refills | Status: DC

## 2016-02-26 NOTE — Progress Notes (Signed)
Patient states that feels good, reports fetal movement.

## 2016-02-26 NOTE — Progress Notes (Signed)
   PRENATAL VISIT NOTE  Subjective:  Holly Glass is a 25 y.o. G2P1001 at 2222w0d being seen today for ongoing prenatal care.  She is currently monitored for the following issues for this low-risk pregnancy and has Eczema; Supervision of normal pregnancy, antepartum; and H/O cesarean section on her problem list.  Patient reports no complaints.  Contractions: Not present. Vag. Bleeding: None.  Movement: Present. Denies leaking of fluid.   The following portions of the patient's history were reviewed and updated as appropriate: allergies, current medications, past family history, past medical history, past social history, past surgical history and problem list. Problem list updated.  Objective:   Vitals:   02/26/16 1506  BP: 122/78  Pulse: (!) 105  Temp: 99.3 F (37.4 C)  Weight: 191 lb 9.6 oz (86.9 kg)    Fetal Status: Fetal Heart Rate (bpm): 147   Movement: Present     General:  Alert, oriented and cooperative. Patient is in no acute distress.  Skin: Skin is warm and dry. No rash noted.   Cardiovascular: Normal heart rate noted  Respiratory: Normal respiratory effort, no problems with respiration noted  Abdomen: Soft, gravid, appropriate for gestational age. Pain/Pressure: Absent     Pelvic:  Cervical exam deferred        Extremities: Normal range of motion.  Edema: None  Mental Status: Normal mood and affect. Normal behavior. Normal judgment and thought content.   Assessment and Plan:  Pregnancy: G2P1001 at 7522w0d  1. Encounter for supervision of normal pregnancy, antepartum, unspecified gravidity Treat sx of yeast vaginitis - US OB Comp + 14 Wk; Future - fluconazole (DIFLUCAN) 150 MG tablet; Take 1 tablet (150 mg total) by mouth once.  Dispense: 1 tablet; Refill: 0  Preterm labor symptoms and general obstetric precautions including but not limited to vaginal bleeding, contractions, leaking of fluid and fetal movement were reviewed in detail with the patient. Please refer to  After Visit Summary for other counseling recommendations.  Return in about 4 weeks (around 03/25/2016). [facility] [place] asap  Adam PhenixJames G Philo Kurtz, MD

## 2016-03-06 ENCOUNTER — Telehealth: Payer: Self-pay

## 2016-03-06 NOTE — Telephone Encounter (Signed)
Returned pt call and answered questions about 2hr glucose testing.

## 2016-03-11 ENCOUNTER — Ambulatory Visit: Payer: Self-pay

## 2016-03-11 DIAGNOSIS — Z3490 Encounter for supervision of normal pregnancy, unspecified, unspecified trimester: Secondary | ICD-10-CM

## 2016-03-25 ENCOUNTER — Ambulatory Visit (INDEPENDENT_AMBULATORY_CARE_PROVIDER_SITE_OTHER): Payer: Medicaid Other | Admitting: Obstetrics and Gynecology

## 2016-03-25 VITALS — BP 121/79 | HR 111 | Wt 198.0 lb

## 2016-03-25 DIAGNOSIS — O34219 Maternal care for unspecified type scar from previous cesarean delivery: Secondary | ICD-10-CM

## 2016-03-25 DIAGNOSIS — Z3482 Encounter for supervision of other normal pregnancy, second trimester: Secondary | ICD-10-CM

## 2016-03-25 DIAGNOSIS — Z3480 Encounter for supervision of other normal pregnancy, unspecified trimester: Secondary | ICD-10-CM

## 2016-03-25 DIAGNOSIS — Z98891 History of uterine scar from previous surgery: Secondary | ICD-10-CM

## 2016-03-25 NOTE — Progress Notes (Signed)
   PRENATAL VISIT NOTE  Subjective:  Holly Glass is a 25 y.o. G2P1001 at 221w0d being seen today for ongoing prenatal care.  She is currently monitored for the following issues for this low-risk pregnancy and has Eczema; Supervision of normal pregnancy, antepartum; and H/O cesarean section on her problem list.  Patient reports no complaints.  Contractions: Irregular. Vag. Bleeding: None.  Movement: Present. Denies leaking of fluid.   The following portions of the patient's history were reviewed and updated as appropriate: allergies, current medications, past family history, past medical history, past social history, past surgical history and problem list. Problem list updated.  Objective:   Vitals:   03/25/16 1520  BP: 121/79  Pulse: (!) 111  Weight: 198 lb (89.8 kg)    Fetal Status: Fetal Heart Rate (bpm): 154 Fundal Height: 24 cm Movement: Present     General:  Alert, oriented and cooperative. Patient is in no acute distress.  Skin: Skin is warm and dry. No rash noted.   Cardiovascular: Normal heart rate noted  Respiratory: Normal respiratory effort, no problems with respiration noted  Abdomen: Soft, gravid, appropriate for gestational age. Pain/Pressure: Absent     Pelvic:  Cervical exam deferred        Extremities: Normal range of motion.  Edema: None  Mental Status: Normal mood and affect. Normal behavior. Normal judgment and thought content.   Assessment and Plan:  Pregnancy: G2P1001 at 631w0d  1. Supervision of other normal pregnancy, antepartum Patient is doing well without complaints glucola and labs next visit Patient plans on using jelly beans  2. H/O cesarean section Previous LTCS- Patient is undecided. Consent provided for review  Preterm labor symptoms and general obstetric precautions including but not limited to vaginal bleeding, contractions, leaking of fluid and fetal movement were reviewed in detail with the patient. Please refer to After Visit Summary for  other counseling recommendations.  Return in about 4 weeks (around 04/22/2016) for ROb and 1 hr glucola with jelly beans (not fasting).   Catalina AntiguaPeggy Lyon Dumont, MD

## 2016-03-25 NOTE — Addendum Note (Signed)
Addended by: Catalina AntiguaONSTANT, Jomarion Mish on: 03/25/2016 04:42 PM   Modules accepted: Orders

## 2016-03-26 ENCOUNTER — Encounter: Payer: Self-pay | Admitting: *Deleted

## 2016-04-01 IMAGING — US US MFM FETAL BPP W/O NON-STRESS
1 series · 12 of 20 positions shown · non-contrast
Comparison: none

[Series 1: us mfm fetal bpp w/o non-stress · 20 acquisitions, 12 frames shown]
[im 1/20]
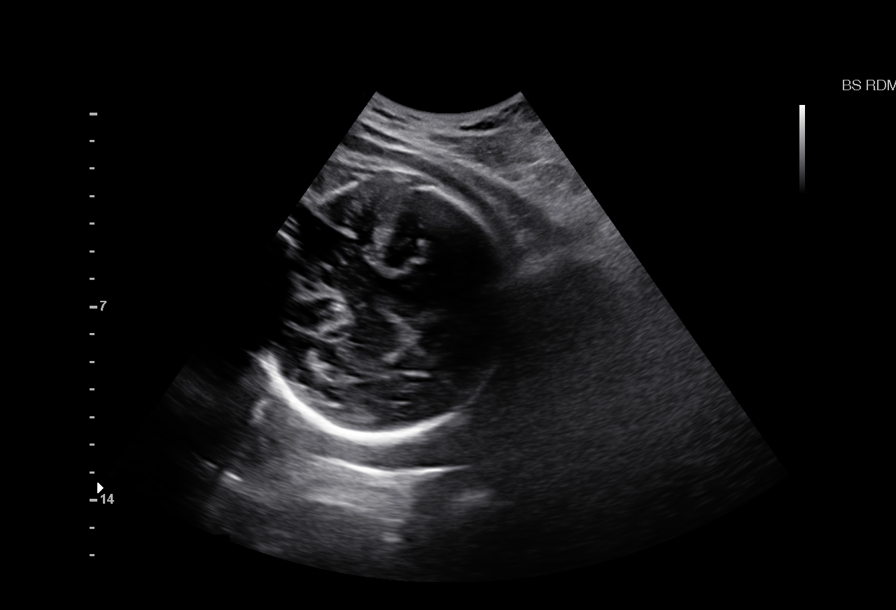
[im 3/20]
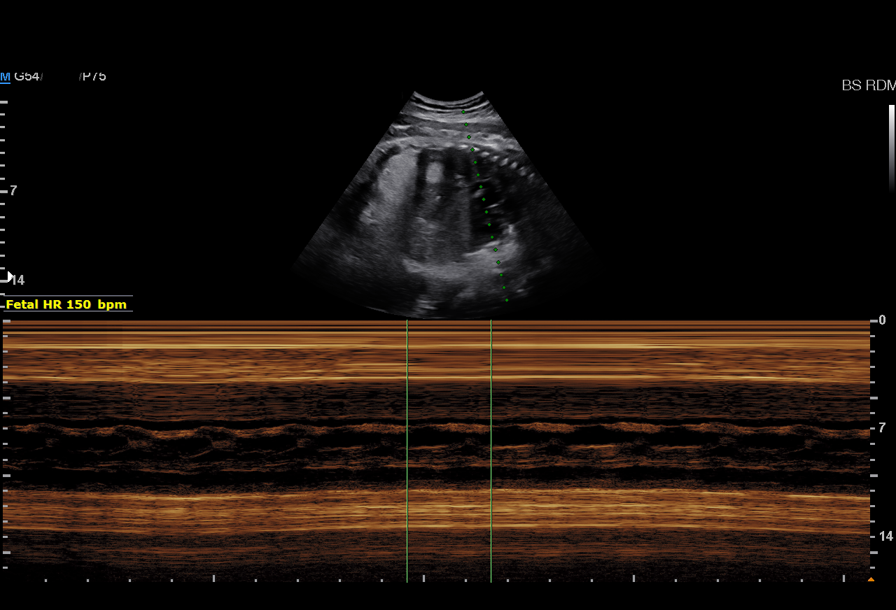
[im 5/20]
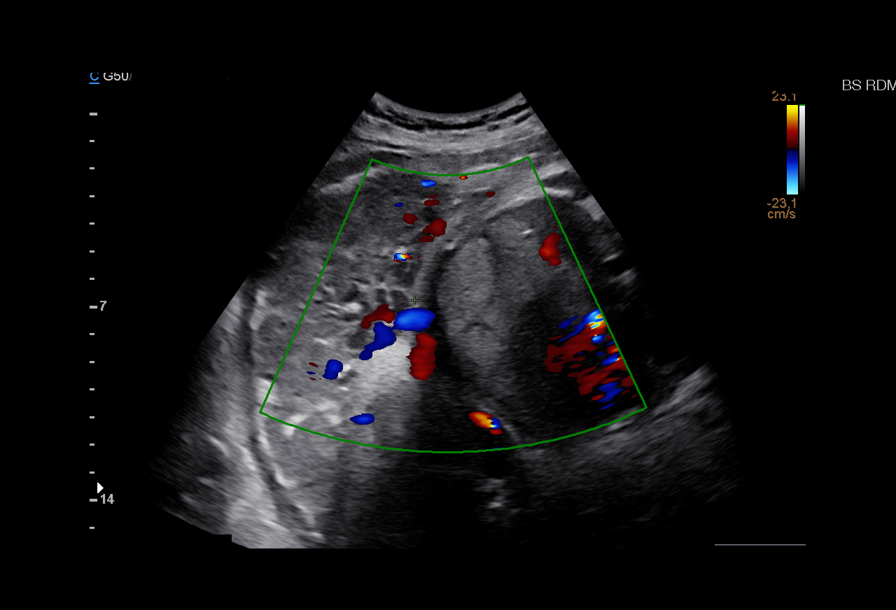
[im 6/20]
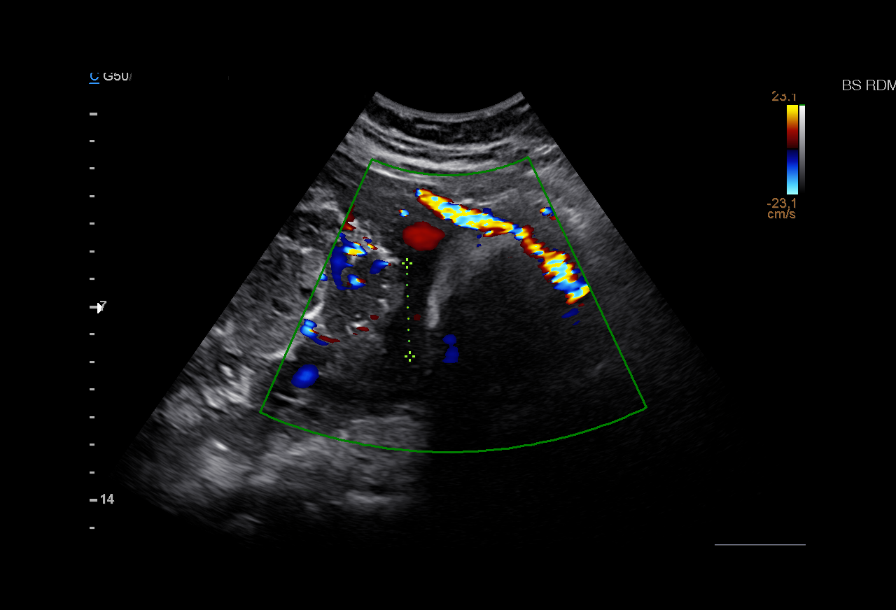
[im 8/20]
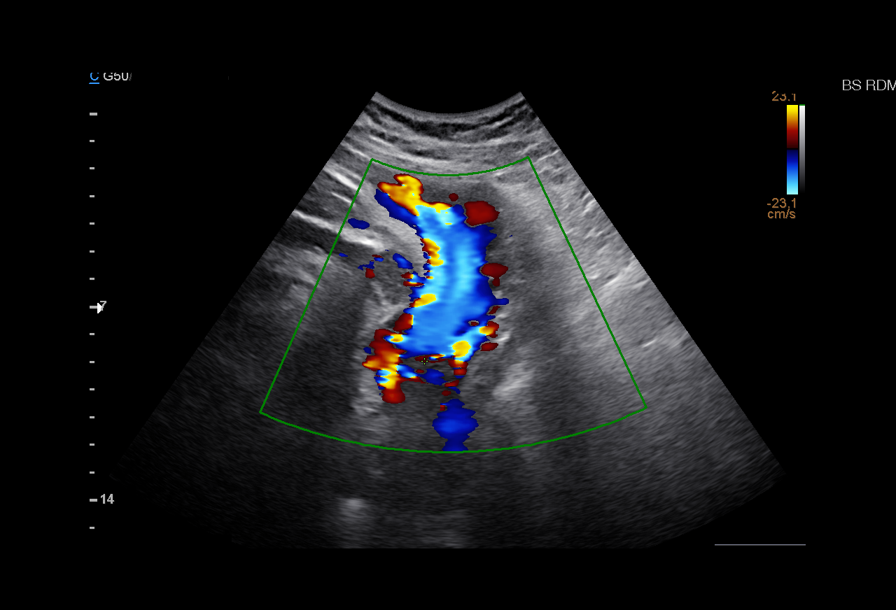
[im 10/20]
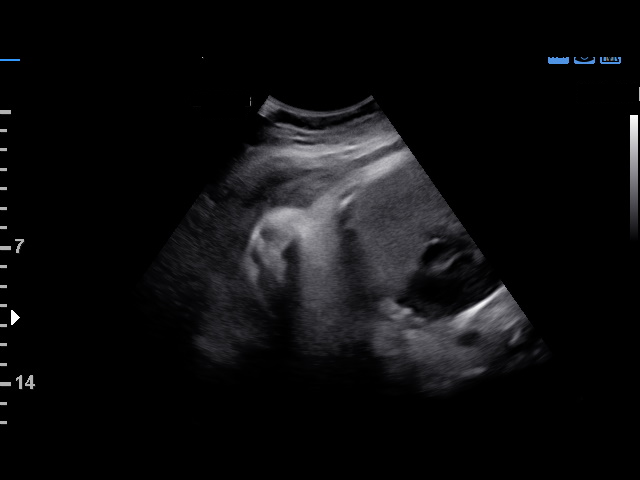
[im 11/20]
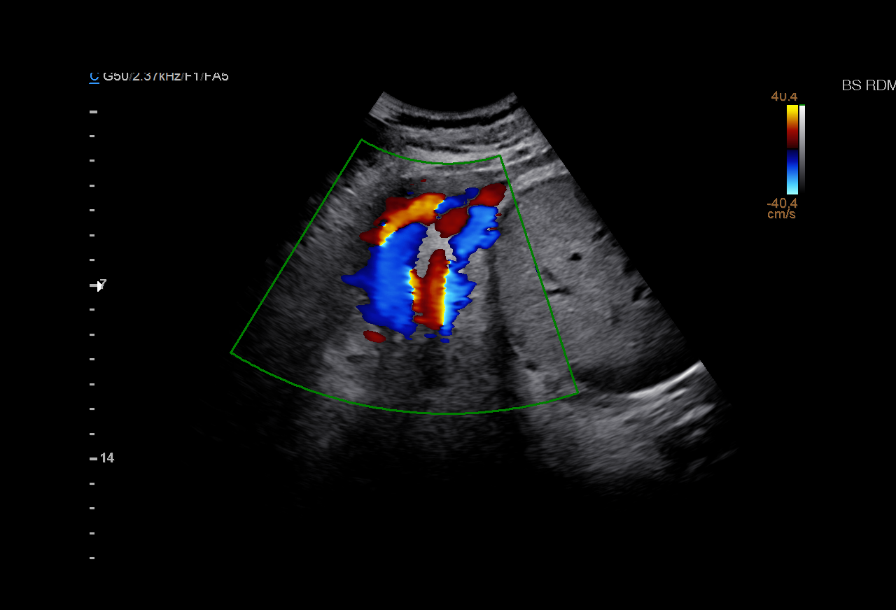
[im 13/20]
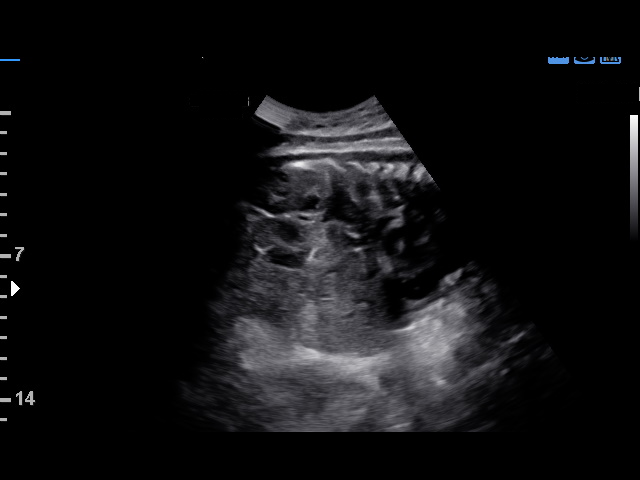
[im 15/20]
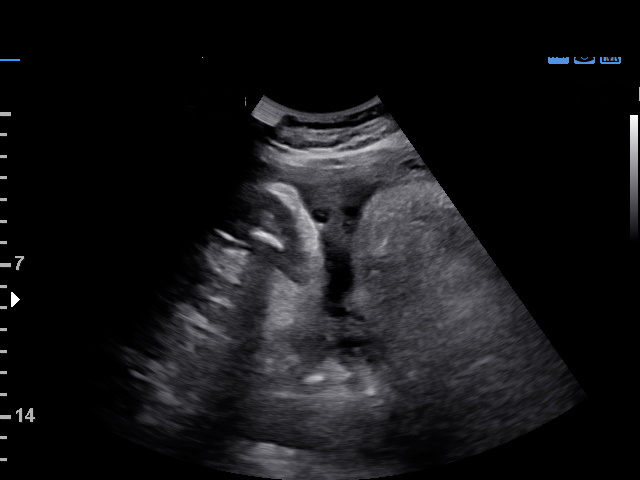
[im 16/20]
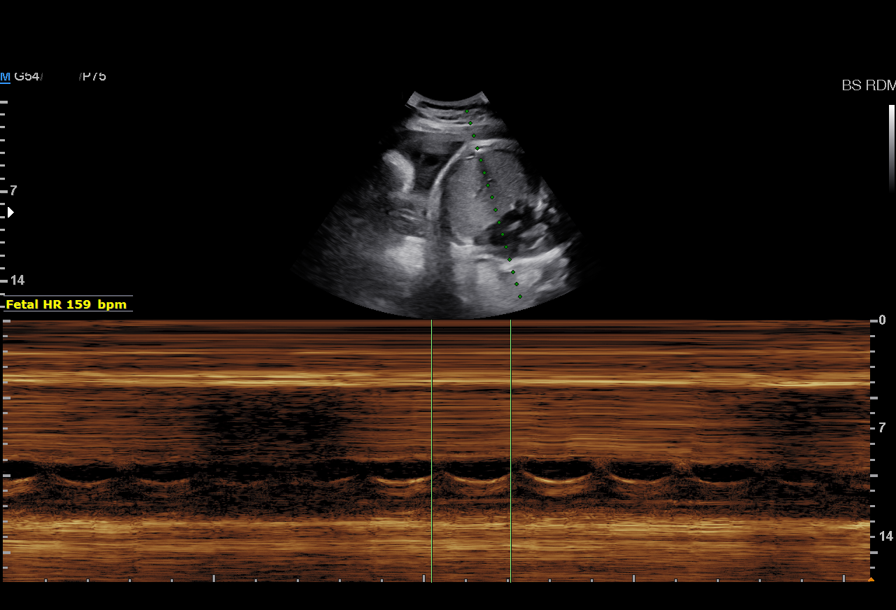
[im 18/20]
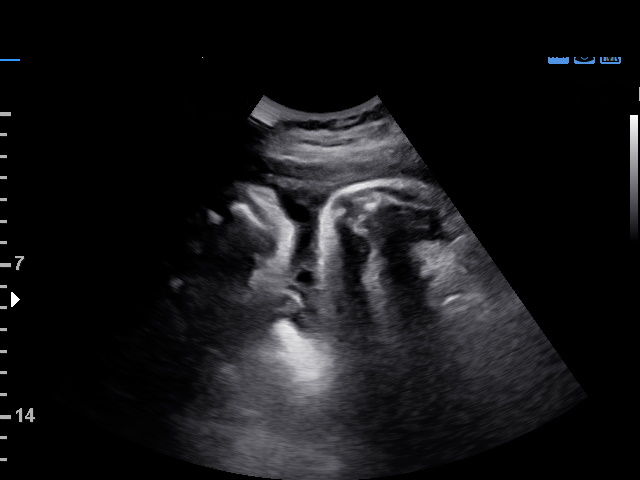
[im 20/20]
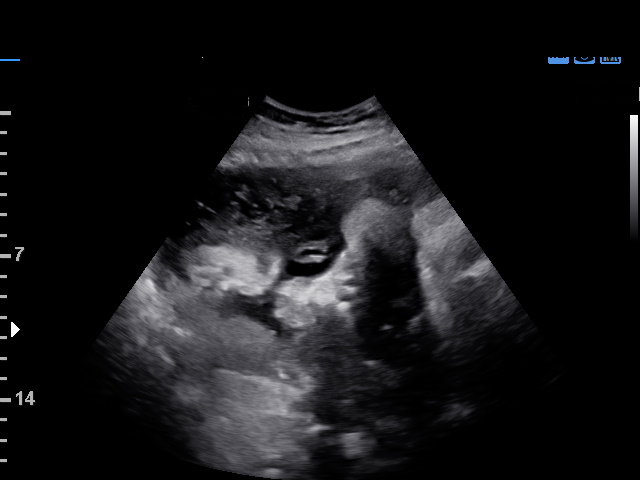

[12 of 20 positions shown; findings below may reference images not displayed]

OBSTETRICS REPORT
(Signed Final 02/06/2015 [DATE])

Name:       RAMANI ZALDIVAR                         Visit  02/03/2015 [DATE]
Date:

Service(s) Provided

Indications

Non-reactive NST, FHR decelerations
Postdate pregnancy (40-42 weeks)
Obesity complicating pregnancy, third trimester
41 weeks gestation of pregnancy
Fetal Evaluation

Num Of             1
Fetuses:
Fetal Heart        159                          bpm
Rate:
Cardiac Activity:  Observed
Presentation:      Cephalic

Amniotic Fluid
AFI FV:      Oligohydramnios
AFI Sum:     3.37     cm     < 3  %Tile     Larg Pckt:    3.37   cm
RUQ:   0       cm    RLQ:   0       cm   LUQ:    0       cm   LLQ:    3.37   cm
Biophysical Evaluation

Amniotic F.V:   Pocket < 2 cm two           F. Tone:        Observed
planes
F. Movement:    Observed                    Score:          [DATE]
F. Breathing:   Observed
Gestational Age

Best:          41w 5d    Det. By:   Early Ultrasound          EDD:   01/22/15
Impression

Single IUP at 41w 5d
BPP [DATE] (-2 for oligohydramnios)
Cephalic presentation
Recommendations

Recommend delivery due to non-reassuring fetal testing
and oligohydramnios

## 2016-04-07 ENCOUNTER — Encounter (HOSPITAL_COMMUNITY): Payer: Self-pay | Admitting: Obstetrics and Gynecology

## 2016-04-18 ENCOUNTER — Ambulatory Visit (HOSPITAL_COMMUNITY)
Admission: RE | Admit: 2016-04-18 | Discharge: 2016-04-18 | Disposition: A | Discharge status: Home / Self Care | Payer: Medicaid Other | Source: Ambulatory Visit | Attending: Obstetrics and Gynecology | Admitting: Obstetrics and Gynecology

## 2016-04-18 ENCOUNTER — Other Ambulatory Visit: Payer: Self-pay | Admitting: Obstetrics and Gynecology

## 2016-04-18 DIAGNOSIS — E669 Obesity, unspecified: Secondary | ICD-10-CM | POA: Insufficient documentation

## 2016-04-18 DIAGNOSIS — O34219 Maternal care for unspecified type scar from previous cesarean delivery: Secondary | ICD-10-CM | POA: Diagnosis not present

## 2016-04-18 DIAGNOSIS — Z3A26 26 weeks gestation of pregnancy: Secondary | ICD-10-CM

## 2016-04-18 DIAGNOSIS — Z6838 Body mass index (BMI) 38.0-38.9, adult: Secondary | ICD-10-CM | POA: Diagnosis not present

## 2016-04-18 DIAGNOSIS — Z363 Encounter for antenatal screening for malformations: Secondary | ICD-10-CM

## 2016-04-18 DIAGNOSIS — O99212 Obesity complicating pregnancy, second trimester: Secondary | ICD-10-CM | POA: Diagnosis not present

## 2016-04-18 DIAGNOSIS — Z3480 Encounter for supervision of other normal pregnancy, unspecified trimester: Secondary | ICD-10-CM

## 2016-04-22 ENCOUNTER — Encounter: Payer: Self-pay | Admitting: Obstetrics and Gynecology

## 2016-04-22 ENCOUNTER — Other Ambulatory Visit: Payer: Self-pay

## 2016-05-01 ENCOUNTER — Ambulatory Visit (INDEPENDENT_AMBULATORY_CARE_PROVIDER_SITE_OTHER): Payer: Medicaid Other | Admitting: Obstetrics and Gynecology

## 2016-05-01 ENCOUNTER — Other Ambulatory Visit: Payer: Medicaid Other

## 2016-05-01 VITALS — BP 113/76 | HR 120 | Temp 98.1°F | Wt 203.8 lb

## 2016-05-01 DIAGNOSIS — Z23 Encounter for immunization: Secondary | ICD-10-CM | POA: Diagnosis not present

## 2016-05-01 DIAGNOSIS — Z3483 Encounter for supervision of other normal pregnancy, third trimester: Secondary | ICD-10-CM

## 2016-05-01 DIAGNOSIS — O34219 Maternal care for unspecified type scar from previous cesarean delivery: Secondary | ICD-10-CM

## 2016-05-01 DIAGNOSIS — Z3480 Encounter for supervision of other normal pregnancy, unspecified trimester: Secondary | ICD-10-CM

## 2016-05-01 DIAGNOSIS — Z98891 History of uterine scar from previous surgery: Secondary | ICD-10-CM

## 2016-05-01 NOTE — Progress Notes (Signed)
Patient is in the office, reports good fetal movement. 

## 2016-05-01 NOTE — Progress Notes (Signed)
   PRENATAL VISIT NOTE  Subjective:  Holly Glass is a 26 y.o. G2P1001 at 1470w2d being seen today for ongoing prenatal care.  She is currently monitored for the following issues for this low-risk pregnancy and has Eczema; Supervision of normal pregnancy, antepartum; and H/O cesarean section on her problem list.  Patient reports no complaints.  Contractions: Not present. Vag. Bleeding: None.  Movement: Present. Denies leaking of fluid.   The following portions of the patient's history were reviewed and updated as appropriate: allergies, current medications, past family history, past medical history, past social history, past surgical history and problem list. Problem list updated.  Objective:   Vitals:   05/01/16 0834  BP: 113/76  Pulse: (!) 120  Temp: 98.1 F (36.7 C)  Weight: 203 lb 12.8 oz (92.4 kg)    Fetal Status: Fetal Heart Rate (bpm): 162   Movement: Present     General:  Alert, oriented and cooperative. Patient is in no acute distress.  Skin: Skin is warm and dry. No rash noted.   Cardiovascular: Normal heart rate noted  Respiratory: Normal respiratory effort, no problems with respiration noted  Abdomen: Soft, gravid, appropriate for gestational age. Pain/Pressure: Absent     Pelvic:  Cervical exam deferred        Extremities: Normal range of motion.  Edema: Trace  Mental Status: Normal mood and affect. Normal behavior. Normal judgment and thought content.   Assessment and Plan:  Pregnancy: G2P1001 at 6470w2d  1. H/O cesarean section Patient desires TOLAC Consent signed today  2. Supervision of other normal pregnancy, antepartum Patient is doing well 1hr glucola and labs today Patient desires Tdap - Glucose tolerance, 1 hour - CBC - HIV antibody - RPR - Tdap vaccine greater than or equal to 7yo IM  Preterm labor symptoms and general obstetric precautions including but not limited to vaginal bleeding, contractions, leaking of fluid and fetal movement were  reviewed in detail with the patient. Please refer to After Visit Summary for other counseling recommendations.  No Follow-up on file.   Catalina AntiguaPeggy Sophina Mitten, MD

## 2016-05-02 ENCOUNTER — Encounter: Payer: Self-pay | Admitting: Advanced Practice Midwife

## 2016-05-02 ENCOUNTER — Other Ambulatory Visit: Payer: Self-pay

## 2016-05-02 LAB — HIV ANTIBODY (ROUTINE TESTING W REFLEX): HIV SCREEN 4TH GENERATION: NONREACTIVE

## 2016-05-02 LAB — RPR: RPR Ser Ql: NONREACTIVE

## 2016-05-02 LAB — CBC
Hematocrit: 37.3 % (ref 34.0–46.6)
Hemoglobin: 12.2 g/dL (ref 11.1–15.9)
MCH: 28.2 pg (ref 26.6–33.0)
MCHC: 32.7 g/dL (ref 31.5–35.7)
MCV: 86 fL (ref 79–97)
PLATELETS: 216 10*3/uL (ref 150–379)
RBC: 4.32 x10E6/uL (ref 3.77–5.28)
RDW: 14.1 % (ref 12.3–15.4)
WBC: 8.3 10*3/uL (ref 3.4–10.8)

## 2016-05-02 LAB — GLUCOSE TOLERANCE, 1 HOUR: Glucose, 1Hr PP: 61 mg/dL — ABNORMAL LOW (ref 65–199)

## 2016-05-15 ENCOUNTER — Ambulatory Visit (INDEPENDENT_AMBULATORY_CARE_PROVIDER_SITE_OTHER): Payer: Medicaid Other | Admitting: Certified Nurse Midwife

## 2016-05-15 VITALS — BP 120/73 | HR 110 | Wt 204.0 lb

## 2016-05-15 DIAGNOSIS — Z98891 History of uterine scar from previous surgery: Secondary | ICD-10-CM

## 2016-05-15 DIAGNOSIS — Z3483 Encounter for supervision of other normal pregnancy, third trimester: Secondary | ICD-10-CM

## 2016-05-15 DIAGNOSIS — Z3480 Encounter for supervision of other normal pregnancy, unspecified trimester: Secondary | ICD-10-CM

## 2016-05-15 NOTE — Progress Notes (Signed)
   PRENATAL VISIT NOTE  Subjective:  [name] is a 26 y.o. G2P1001 at 7141w2d being seen today for ongoing prenatal care.  She is currently monitored for the following issues for this low-risk pregnancy and has Eczema; Supervision of normal pregnancy, antepartum; and H/O cesarean section on her problem list.  Patient reports no complaints.  Contractions: Not present. Vag. Bleeding: None.  Movement: Present. Denies leaking of fluid.   The following portions of the patient's history were reviewed and updated as appropriate: allergies, current medications, past family history, past medical history, past social history, past surgical history and problem list. Problem list updated.  Objective:   Vitals:   05/15/16 1040  BP: 120/73  Pulse: (!) 110  Weight: 204 lb (92.5 kg)    Fetal Status: Fetal Heart Rate (bpm): 152 Fundal Height: 31 cm Movement: Present     General:  Alert, oriented and cooperative. Patient is in no acute distress.  Skin: Skin is warm and dry. No rash noted.   Cardiovascular: Normal heart rate noted  Respiratory: Normal respiratory effort, no problems with respiration noted  Abdomen: Soft, gravid, appropriate for gestational age. Pain/Pressure: Present     Pelvic:  Cervical exam deferred        Extremities: Normal range of motion.     Mental Status: Normal mood and affect. Normal behavior. Normal judgment and thought content.   Assessment and Plan:  Pregnancy: G2P1001 at 6041w2d  1. Supervision of other normal pregnancy, antepartum     Doing well  2. H/O cesarean section     TOLAC planned, consent in media file  Preterm labor symptoms and general obstetric precautions including but not limited to vaginal bleeding, contractions, leaking of fluid and fetal movement were reviewed in detail with the patient. Please refer to After Visit Summary for other counseling recommendations.  Return in about 2 weeks (around 05/29/2016) for ROB.   Holly Glass, CNM

## 2016-05-15 NOTE — Patient Instructions (Addendum)
Third Trimester of Pregnancy The third trimester is from week 29 through week 40 (months 7 through 9). The third trimester is a time when the unborn baby (fetus) is growing rapidly. At the end of the ninth month, the fetus is about 20 inches in length and weighs 6-10 pounds. Body changes during your third trimester Your body goes through many changes during pregnancy. The changes vary from woman to woman. During the third trimester:  Your weight will continue to increase. You can expect to gain 25-35 pounds (11-16 kg) by the end of the pregnancy.  You may begin to get stretch marks on your hips, abdomen, and breasts.  You may urinate more often because the fetus is moving lower into your pelvis and pressing on your bladder.  You may develop or continue to have heartburn. This is caused by increased hormones that slow down muscles in the digestive tract.  You may develop or continue to have constipation because increased hormones slow digestion and cause the muscles that push waste through your intestines to relax.  You may develop hemorrhoids. These are swollen veins (varicose veins) in the rectum that can itch or be painful.  You may develop swollen, bulging veins (varicose veins) in your legs.  You may have increased body aches in the pelvis, back, or thighs. This is due to weight gain and increased hormones that are relaxing your joints.  You may have changes in your hair. These can include thickening of your hair, rapid growth, and changes in texture. Some women also have hair loss during or after pregnancy, or hair that feels dry or thin. Your hair will most likely return to normal after your baby is born.  Your breasts will continue to grow and they will continue to become tender. A yellow fluid (colostrum) may leak from your breasts. This is the first milk you are producing for your baby.  Your belly button may stick out.  You may notice more swelling in your hands, face, or  ankles.  You may have increased tingling or numbness in your hands, arms, and legs. The skin on your belly may also feel numb.  You may feel short of breath because of your expanding uterus.  You may have more problems sleeping. This can be caused by the size of your belly, increased need to urinate, and an increase in your body's metabolism.  You may notice the fetus "dropping," or moving lower in your abdomen.  You may have increased vaginal discharge.  Your cervix becomes thin and soft (effaced) near your due date. What to expect at prenatal visits You will have prenatal exams every 2 weeks until week 36. Then you will have weekly prenatal exams. During a routine prenatal visit:  You will be weighed to make sure you and the fetus are growing normally.  Your blood pressure will be taken.  Your abdomen will be measured to track your baby's growth.  The fetal heartbeat will be listened to.  Any test results from the previous visit will be discussed.  You may have a cervical check near your due date to see if you have effaced. At around 36 weeks, your health care provider will check your cervix. At the same time, your health care provider will also perform a test on the secretions of the vaginal tissue. This test is to determine if a type of bacteria, Group B streptococcus, is present. Your health care provider will explain this further. Your health care provider may ask you:    What your birth plan is.  How you are feeling.  If you are feeling the baby move.  If you have had any abnormal symptoms, such as leaking fluid, bleeding, severe headaches, or abdominal cramping.  If you are using any tobacco products, including cigarettes, chewing tobacco, and electronic cigarettes.  If you have any questions. Other tests or screenings that may be performed during your third trimester include:  Blood tests that check for low iron levels (anemia).  Fetal testing to check the health,  activity level, and growth of the fetus. Testing is done if you have certain medical conditions or if there are problems during the pregnancy.  Nonstress test (NST). This test checks the health of your baby to make sure there are no signs of problems, such as the baby not getting enough oxygen. During this test, a belt is placed around your belly. The baby is made to move, and its heart rate is monitored during movement. What is false labor? False labor is a condition in which you feel small, irregular tightenings of the muscles in the womb (contractions) that eventually go away. These are called Braxton Hicks contractions. Contractions may last for hours, days, or even weeks before true labor sets in. If contractions come at regular intervals, become more frequent, increase in intensity, or become painful, you should see your health care provider. What are the signs of labor?  Abdominal cramps.  Regular contractions that start at 10 minutes apart and become stronger and more frequent with time.  Contractions that start on the top of the uterus and spread down to the lower abdomen and back.  Increased pelvic pressure and dull back pain.  A watery or bloody mucus discharge that comes from the vagina.  Leaking of amniotic fluid. This is also known as your "water breaking." It could be a slow trickle or a gush. Let your doctor know if it has a color or strange odor. If you have any of these signs, call your health care provider right away, even if it is before your due date. Follow these instructions at home: Eating and drinking  Continue to eat regular, healthy meals.  Do not eat:  Raw meat or meat spreads.  Unpasteurized milk or cheese.  Unpasteurized juice.  Store-made salad.  Refrigerated smoked seafood.  Hot dogs or deli meat, unless they are piping hot.  More than 6 ounces of albacore tuna a week.  Shark, swordfish, king mackerel, or tile fish.  Store-made salads.  Raw  sprouts, such as mung bean or alfalfa sprouts.  Take prenatal vitamins as told by your health care provider.  Take 1000 mg of calcium daily as told by your health care provider.  If you develop constipation:  Take over-the-counter or prescription medicines.  Drink enough fluid to keep your urine clear or pale yellow.  Eat foods that are high in fiber, such as fresh fruits and vegetables, whole grains, and beans.  Limit foods that are high in fat and processed sugars, such as fried and sweet foods. Activity  Exercise only as directed by your health care provider. Healthy pregnant women should aim for 2 hours and 30 minutes of moderate exercise per week. If you experience any pain or discomfort while exercising, stop.  Avoid heavy lifting.  Do not exercise in extreme heat or humidity, or at high altitudes.  Wear low-heel, comfortable shoes.  Practice good posture.  Do not travel far distances unless it is absolutely necessary and only with the approval   of your health care provider.  Wear your seat belt at all times while in a car, on a bus, or on a plane.  Take frequent breaks and rest with your legs elevated if you have leg cramps or low back pain.  Do not use hot tubs, steam rooms, or saunas.  You may continue to have sex unless your health care provider tells you otherwise. Lifestyle  Do not use any products that contain nicotine or tobacco, such as cigarettes and e-cigarettes. If you need help quitting, ask your health care provider.  Do not drink alcohol.  Do not use any medicinal herbs or unprescribed drugs. These chemicals affect the formation and growth of the baby.  If you develop varicose veins:  Wear support pantyhose or compression stockings as told by your healthcare provider.  Elevate your feet for 15 minutes, 3-4 times a day.  Wear a supportive maternity bra to help with breast tenderness. General instructions  Take over-the-counter and prescription  medicines only as told by your health care provider. There are medicines that are either safe or unsafe to take during pregnancy.  Take warm sitz baths to soothe any pain or discomfort caused by hemorrhoids. Use hemorrhoid cream or witch hazel if your health care provider approves.  Avoid cat litter boxes and soil used by cats. These carry germs that can cause birth defects in the baby. If you have a cat, ask someone to clean the litter box for you.  To prepare for the arrival of your baby:  Take prenatal classes to understand, practice, and ask questions about the labor and delivery.  Make a trial run to the hospital.  Visit the hospital and tour the maternity area.  Arrange for maternity or paternity leave through employers.  Arrange for family and friends to take care of pets while you are in the hospital.  Purchase a rear-facing car seat and make sure you know how to install it in your car.  Pack your hospital bag.  Prepare the baby's nursery. Make sure to remove all pillows and stuffed animals from the baby's crib to prevent suffocation.  Visit your dentist if you have not gone during your pregnancy. Use a soft toothbrush to brush your teeth and be gentle when you floss.  Keep all prenatal follow-up visits as told by your health care provider. This is important. Contact a health care provider if:  You are unsure if you are in labor or if your water has broken.  You become dizzy.  You have mild pelvic cramps, pelvic pressure, or nagging pain in your abdominal area.  You have lower back pain.  You have persistent nausea, vomiting, or diarrhea.  You have an unusual or bad smelling vaginal discharge.  You have pain when you urinate. Get help right away if:  You have a fever.  You are leaking fluid from your vagina.  You have spotting or bleeding from your vagina.  You have severe abdominal pain or cramping.  You have rapid weight loss or weight gain.  You have  shortness of breath with chest pain.  You notice sudden or extreme swelling of your face, hands, ankles, feet, or legs.  Your baby makes fewer than 10 movements in 2 hours.  You have severe headaches that do not go away with medicine.  You have vision changes. Summary  The third trimester is from week 29 through week 40, months 7 through 9. The third trimester is a time when the unborn baby (fetus)   is growing rapidly.  During the third trimester, your discomfort may increase as you and your baby continue to gain weight. You may have abdominal, leg, and back pain, sleeping problems, and an increased need to urinate.  During the third trimester your breasts will keep growing and they will continue to become tender. A yellow fluid (colostrum) may leak from your breasts. This is the first milk you are producing for your baby.  False labor is a condition in which you feel small, irregular tightenings of the muscles in the womb (contractions) that eventually go away. These are called Braxton Hicks contractions. Contractions may last for hours, days, or even weeks before true labor sets in.  Signs of labor can include: abdominal cramps; regular contractions that start at 10 minutes apart and become stronger and more frequent with time; watery or bloody mucus discharge that comes from the vagina; increased pelvic pressure and dull back pain; and leaking of amniotic fluid. This information is not intended to replace advice given to you by your health care provider. Make sure you discuss any questions you have with your health care provider. Document Released: 03/11/2001 Document Revised: 08/23/2015 Document Reviewed: 05/18/2012 Elsevier Interactive Patient Education  2017 [employer] The third trimester is from week 29 through week 40 (months 7 through 9). The third trimester is a time when the unborn baby (fetus) is growing rapidly. At the end of the ninth month, the  fetus is about 20 inches in length and weighs 6-10 pounds. Body changes during your third trimester Your body goes through many changes during pregnancy. The changes vary from woman to woman. During the third trimester:  Your weight will continue to increase. You can expect to gain 25-35 pounds (11-16 kg) by the end of the pregnancy.  You may begin to get stretch marks on your hips, abdomen, and breasts.  You may urinate more often because the fetus is moving lower into your pelvis and pressing on your bladder.  You may develop or continue to have heartburn. This is caused by increased hormones that slow down muscles in the digestive tract.  You may develop or continue to have constipation because increased hormones slow digestion and cause the muscles that push waste through your intestines to relax.  You may develop hemorrhoids. These are swollen veins (varicose veins) in the rectum that can itch or be painful.  You may develop swollen, bulging veins (varicose veins) in your legs.  You may have increased body aches in the pelvis, back, or thighs. This is due to weight gain and increased hormones that are relaxing your joints.  You may have changes in your hair. These can include thickening of your hair, rapid growth, and changes in texture. Some women also have hair loss during or after pregnancy, or hair that feels dry or thin. Your hair will most likely return to normal after your baby is born.  Your breasts will continue to grow and they will continue to become tender. A yellow fluid (colostrum) may leak from your breasts. This is the first milk you are producing for your baby.  Your belly button may stick out.  You may notice more swelling in your hands, face, or ankles.  You may have increased tingling or numbness in your hands, arms, and legs. The skin on your belly may also feel numb.  You may feel short of breath because of your expanding uterus.  You may have more problems  sleeping. This can  be caused by the size of your belly, increased need to urinate, and an increase in your body's metabolism.  You may notice the fetus "dropping," or moving lower in your abdomen.  You may have increased vaginal discharge.  Your cervix becomes thin and soft (effaced) near your due date. What to expect at prenatal visits You will have prenatal exams every 2 weeks until week 36. Then you will have weekly prenatal exams. During a routine prenatal visit:  You will be weighed to make sure you and the fetus are growing normally.  Your blood pressure will be taken.  Your abdomen will be measured to track your baby's growth.  The fetal heartbeat will be listened to.  Any test results from the previous visit will be discussed.  You may have a cervical check near your due date to see if you have effaced. At around 36 weeks, your health care provider will check your cervix. At the same time, your health care provider will also perform a test on the secretions of the vaginal tissue. This test is to determine if a type of bacteria, Group B streptococcus, is present. Your health care provider will explain this further. Your health care provider may ask you:  What your birth plan is.  How you are feeling.  If you are feeling the baby move.  If you have had any abnormal symptoms, such as leaking fluid, bleeding, severe headaches, or abdominal cramping.  If you are using any tobacco products, including cigarettes, chewing tobacco, and electronic cigarettes.  If you have any questions. Other tests or screenings that may be performed during your third trimester include:  Blood tests that check for low iron levels (anemia).  Fetal testing to check the health, activity level, and growth of the fetus. Testing is done if you have certain medical conditions or if there are problems during the pregnancy.  Nonstress test (NST). This test checks the health of your baby to make sure  there are no signs of problems, such as the baby not getting enough oxygen. During this test, a belt is placed around your belly. The baby is made to move, and its heart rate is monitored during movement. What is false labor? False labor is a condition in which you feel small, irregular tightenings of the muscles in the womb (contractions) that eventually go away. These are called Braxton Hicks contractions. Contractions may last for hours, days, or even weeks before true labor sets in. If contractions come at regular intervals, become more frequent, increase in intensity, or become painful, you should see your health care provider. What are the signs of labor?  Abdominal cramps.  Regular contractions that start at 10 minutes apart and become stronger and more frequent with time.  Contractions that start on the top of the uterus and spread down to the lower abdomen and back.  Increased pelvic pressure and dull back pain.  A watery or bloody mucus discharge that comes from the vagina.  Leaking of amniotic fluid. This is also known as your "water breaking." It could be a slow trickle or a gush. Let your doctor know if it has a color or strange odor. If you have any of these signs, call your health care provider right away, even if it is before your due date. Follow these instructions at home: Eating and drinking  Continue to eat regular, healthy meals.  Do not eat:  Raw meat or meat spreads.  Unpasteurized milk or cheese.  Unpasteurized juice.  Store-made salad.  Refrigerated smoked seafood.  Hot dogs or deli meat, unless they are piping hot.  More than 6 ounces of albacore tuna a week.  Shark, swordfish, king mackerel, or tile fish.  Store-made salads.  Raw sprouts, such as mung bean or alfalfa sprouts.  Take prenatal vitamins as told by your health care provider.  Take 1000 mg of calcium daily as told by your health care provider.  If you develop constipation:  Take  over-the-counter or prescription medicines.  Drink enough fluid to keep your urine clear or pale yellow.  Eat foods that are high in fiber, such as fresh fruits and vegetables, whole grains, and beans.  Limit foods that are high in fat and processed sugars, such as fried and sweet foods. Activity  Exercise only as directed by your health care provider. Healthy pregnant women should aim for 2 hours and 30 minutes of moderate exercise per week. If you experience any pain or discomfort while exercising, stop.  Avoid heavy lifting.  Do not exercise in extreme heat or humidity, or at high altitudes.  Wear low-heel, comfortable shoes.  Practice good posture.  Do not travel far distances unless it is absolutely necessary and only with the approval of your health care provider.  Wear your seat belt at all times while in a car, on a bus, or on a plane.  Take frequent breaks and rest with your legs elevated if you have leg cramps or low back pain.  Do not use hot tubs, steam rooms, or saunas.  You may continue to have sex unless your health care provider tells you otherwise. Lifestyle  Do not use any products that contain nicotine or tobacco, such as cigarettes and e-cigarettes. If you need help quitting, ask your health care provider.  Do not drink alcohol.  Do not use any medicinal herbs or unprescribed drugs. These chemicals affect the formation and growth of the baby.  If you develop varicose veins:  Wear support pantyhose or compression stockings as told by your healthcare provider.  Elevate your feet for 15 minutes, 3-4 times a day.  Wear a supportive maternity bra to help with breast tenderness. General instructions  Take over-the-counter and prescription medicines only as told by your health care provider. There are medicines that are either safe or unsafe to take during pregnancy.  Take warm sitz baths to soothe any pain or discomfort caused by hemorrhoids. Use  hemorrhoid cream or witch hazel if your health care provider approves.  Avoid cat litter boxes and soil used by cats. These carry germs that can cause birth defects in the baby. If you have a cat, ask someone to clean the litter box for you.  To prepare for the arrival of your baby:  Take prenatal classes to understand, practice, and ask questions about the labor and delivery.  Make a trial run to the hospital.  Visit the hospital and tour the maternity area.  Arrange for maternity or paternity leave through employers.  Arrange for family and friends to take care of pets while you are in the hospital.  Purchase a rear-facing car seat and make sure you know how to install it in your car.  Pack your hospital bag.  Prepare the baby's nursery. Make sure to remove all pillows and stuffed animals from the baby's crib to prevent suffocation.  Visit your dentist if you have not gone during your pregnancy. Use a soft toothbrush to brush your teeth and be gentle when you floss.  Keep all prenatal follow-up visits as told by your health care provider. This is important. Contact a health care provider if:  You are unsure if you are in labor or if your water has broken.  You become dizzy.  You have mild pelvic cramps, pelvic pressure, or nagging pain in your abdominal area.  You have lower back pain.  You have persistent nausea, vomiting, or diarrhea.  You have an unusual or bad smelling vaginal discharge.  You have pain when you urinate. Get help right away if:  You have a fever.  You are leaking fluid from your vagina.  You have spotting or bleeding from your vagina.  You have severe abdominal pain or cramping.  You have rapid weight loss or weight gain.  You have shortness of breath with chest pain.  You notice sudden or extreme swelling of your face, hands, ankles, feet, or legs.  Your baby makes fewer than 10 movements in 2 hours.  You have severe headaches that do  not go away with medicine.  You have vision changes. Summary  The third trimester is from week 29 through week 40, months 7 through 9. The third trimester is a time when the unborn baby (fetus) is growing rapidly.  During the third trimester, your discomfort may increase as you and your baby continue to gain weight. You may have abdominal, leg, and back pain, sleeping problems, and an increased need to urinate.  During the third trimester your breasts will keep growing and they will continue to become tender. A yellow fluid (colostrum) may leak from your breasts. This is the first milk you are producing for your baby.  False labor is a condition in which you feel small, irregular tightenings of the muscles in the womb (contractions) that eventually go away. These are called Braxton Hicks contractions. Contractions may last for hours, days, or even weeks before true labor sets in.  Signs of labor can include: abdominal cramps; regular contractions that start at 10 minutes apart and become stronger and more frequent with time; watery or bloody mucus discharge that comes from the vagina; increased pelvic pressure and dull back pain; and leaking of amniotic fluid. This information is not intended to replace advice given to you by your health care provider. Make sure you discuss any questions you have with your health care provider. Document Released: 03/11/2001 Document Revised: 08/23/2015 Document Reviewed: 05/18/2012 [employer] Interactive Patient Education  2017 [employer]

## 2016-05-29 ENCOUNTER — Ambulatory Visit (INDEPENDENT_AMBULATORY_CARE_PROVIDER_SITE_OTHER): Payer: Medicaid Other | Admitting: Certified Nurse Midwife

## 2016-05-29 ENCOUNTER — Encounter: Payer: Self-pay | Admitting: Certified Nurse Midwife

## 2016-05-29 VITALS — BP 118/75 | HR 111 | Wt 207.6 lb

## 2016-05-29 DIAGNOSIS — O34219 Maternal care for unspecified type scar from previous cesarean delivery: Secondary | ICD-10-CM

## 2016-05-29 DIAGNOSIS — Z98891 History of uterine scar from previous surgery: Secondary | ICD-10-CM

## 2016-05-29 DIAGNOSIS — Z3480 Encounter for supervision of other normal pregnancy, unspecified trimester: Secondary | ICD-10-CM

## 2016-05-29 DIAGNOSIS — Z3483 Encounter for supervision of other normal pregnancy, third trimester: Secondary | ICD-10-CM

## 2016-05-29 NOTE — Progress Notes (Signed)
Sometimes patient has pain ULQ - but other than that she reports she is doing well.

## 2016-05-29 NOTE — Progress Notes (Signed)
   PRENATAL VISIT NOTE  Subjective:  Holly Glass is a 26 y.o. G2P1001 at 8181w2d being seen today for ongoing prenatal care.  She is currently monitored for the following issues for this low-risk pregnancy and has Eczema; Supervision of normal pregnancy, antepartum; and H/O cesarean section on her problem list.  Patient reports no complaints.  Contractions: Not present. Vag. Bleeding: None.  Movement: Present. Denies leaking of fluid.   The following portions of the patient's history were reviewed and updated as appropriate: allergies, current medications, past family history, past medical history, past social history, past surgical history and problem list. Problem list updated.  Objective:   Vitals:   05/29/16 1025  BP: 118/75  Pulse: (!) 111  Weight: 207 lb 9.6 oz (94.2 kg)    Fetal Status: Fetal Heart Rate (bpm): 158 Fundal Height: 34 cm Movement: Present     General:  Alert, oriented and cooperative. Patient is in no acute distress.  Skin: Skin is warm and dry. No rash noted.   Cardiovascular: Normal heart rate noted  Respiratory: Normal respiratory effort, no problems with respiration noted  Abdomen: Soft, gravid, appropriate for gestational age. Pain/Pressure: Absent     Pelvic:  Cervical exam deferred        Extremities: Normal range of motion.  Edema: None  Mental Status: Normal mood and affect. Normal behavior. Normal judgment and thought content.   Assessment and Plan:  Pregnancy: G2P1001 at 7081w2d  There are no diagnoses linked to this encounter. Preterm labor symptoms and general obstetric precautions including but not limited to vaginal bleeding, contractions, leaking of fluid and fetal movement were reviewed in detail with the patient. Please refer to After Visit Summary for other counseling recommendations.  Return in about 2 weeks (around 06/12/2016) for ROB.   Lynnell JudeJade C Daion Ginsberg, FNP

## 2016-06-13 ENCOUNTER — Encounter: Payer: Self-pay | Admitting: Certified Nurse Midwife

## 2016-06-13 ENCOUNTER — Ambulatory Visit (INDEPENDENT_AMBULATORY_CARE_PROVIDER_SITE_OTHER): Payer: Medicaid Other | Admitting: Certified Nurse Midwife

## 2016-06-13 DIAGNOSIS — Z3480 Encounter for supervision of other normal pregnancy, unspecified trimester: Secondary | ICD-10-CM

## 2016-06-13 DIAGNOSIS — Z3483 Encounter for supervision of other normal pregnancy, third trimester: Secondary | ICD-10-CM

## 2016-06-13 NOTE — Patient Instructions (Signed)
Places to have your son circumcised:    [facility] [phone] $480 by 4 wks  [facility] [phone] $244 by 4 wks  [facility] [phone] $175 by 2 wks  [facility] [phone] $220 by 4 wks [facility] [phone] $150 by 4 wks  These prices sometimes change but are roughly what you can expect to pay. Please call and confirm pricing.   Circumcision is considered an elective/non-medically necessary procedure. There are many reasons parents decide to have their sons circumsized. During the first year of life circumcised males have a reduced risk of urinary tract infections but after this year the rates between circumcised males and uncircumcised males are the same.  It is safe to have your son circumcised outside of the hospital and the places above perform them regularly.

## 2016-06-13 NOTE — Progress Notes (Signed)
Patient states she is feeling good today, reports good fetal movement.

## 2016-06-13 NOTE — Progress Notes (Signed)
   PRENATAL VISIT NOTE  Subjective:  Holly Glass is a 26 y.o. G2P1001 at 956w3d being seen today for ongoing prenatal care.  She is currently monitored for the following issues for this low-risk pregnancy and has Eczema; Supervision of normal pregnancy, antepartum; and H/O cesarean section on her problem list.  Patient reports no complaints.  Contractions: Irregular. Vag. Bleeding: None.  Movement: Present. Denies leaking of fluid.   The following portions of the patient's history were reviewed and updated as appropriate: allergies, current medications, past family history, past medical history, past social history, past surgical history and problem list. Problem list updated.  Objective:   Vitals:   06/13/16 1005  BP: 120/80  Pulse: (!) 120  Temp: 98.7 F (37.1 C)  Weight: 211 lb (95.7 kg)    Fetal Status: Fetal Heart Rate (bpm): 151 Fundal Height: 36 cm Movement: Present     General:  Alert, oriented and cooperative. Patient is in no acute distress.  Skin: Skin is warm and dry. No rash noted.   Cardiovascular: Normal heart rate noted  Respiratory: Normal respiratory effort, no problems with respiration noted  Abdomen: Soft, gravid, appropriate for gestational age. Pain/Pressure: Absent     Pelvic:  Cervical exam deferred        Extremities: Normal range of motion.  Edema: None  Mental Status: Normal mood and affect. Normal behavior. Normal judgment and thought content.   Assessment and Plan:  Pregnancy: G2P1001 at 6556w3d  1. Supervision of other normal pregnancy, antepartum     Doing well. TOLAC planned. Consent in media file.   Preterm labor symptoms and general obstetric precautions including but not limited to vaginal bleeding, contractions, leaking of fluid and fetal movement were reviewed in detail with the patient. Please refer to After Visit Summary for other counseling recommendations.  Return in about 1 week (around 06/20/2016) for ROB, GBS.   Roe Coombsachelle A Denney,  CNM.

## 2016-06-19 ENCOUNTER — Ambulatory Visit (INDEPENDENT_AMBULATORY_CARE_PROVIDER_SITE_OTHER): Payer: Medicaid Other | Admitting: Obstetrics

## 2016-06-19 ENCOUNTER — Encounter: Payer: Self-pay | Admitting: Obstetrics

## 2016-06-19 ENCOUNTER — Other Ambulatory Visit (HOSPITAL_COMMUNITY)
Admission: RE | Admit: 2016-06-19 | Discharge: 2016-06-19 | Disposition: A | Discharge status: Home / Self Care | Payer: Medicaid Other | Source: Ambulatory Visit | Attending: Obstetrics | Admitting: Obstetrics

## 2016-06-19 VITALS — BP 123/77 | HR 135 | Wt 214.2 lb

## 2016-06-19 DIAGNOSIS — Z3483 Encounter for supervision of other normal pregnancy, third trimester: Secondary | ICD-10-CM

## 2016-06-19 DIAGNOSIS — Z3480 Encounter for supervision of other normal pregnancy, unspecified trimester: Secondary | ICD-10-CM

## 2016-06-19 DIAGNOSIS — Z3A35 35 weeks gestation of pregnancy: Secondary | ICD-10-CM | POA: Insufficient documentation

## 2016-06-19 NOTE — Progress Notes (Signed)
Subjective:  Tera HelperChelsea D Steinhardt is a 26 y.o. G2P1001 at 4643w2d being seen today for ongoing prenatal care.  She is currently monitored for the following issues for this low-risk pregnancy and has Eczema; Supervision of normal pregnancy, antepartum; and H/O cesarean section on her problem list.  Patient reports no complaints.  Contractions: Not present. Vag. Bleeding: None.  Movement: Present. Denies leaking of fluid.   The following portions of the patient's history were reviewed and updated as appropriate: allergies, current medications, past family history, past medical history, past social history, past surgical history and problem list. Problem list updated.  Objective:   Vitals:   06/19/16 0854  BP: 123/77  Pulse: (!) 135  Weight: 214 lb 3.2 oz (97.2 kg)    Fetal Status: Fetal Heart Rate (bpm): 150   Movement: Present     General:  Alert, oriented and cooperative. Patient is in no acute distress.  Skin: Skin is warm and dry. No rash noted.   Cardiovascular: Normal heart rate noted  Respiratory: Normal respiratory effort, no problems with respiration noted  Abdomen: Soft, gravid, appropriate for gestational age. Pain/Pressure: Absent     Pelvic:  Cervical exam deferred        Extremities: Normal range of motion.  Edema: None  Mental Status: Normal mood and affect. Normal behavior. Normal judgment and thought content.   Urinalysis:      Assessment and Plan:  Pregnancy: G2P1001 at 3543w2d  There are no diagnoses linked to this encounter. Preterm labor symptoms and general obstetric precautions including but not limited to vaginal bleeding, contractions, leaking of fluid and fetal movement were reviewed in detail with the patient. Please refer to After Visit Summary for other counseling recommendations.  Return in 1 week (on 06/26/2016).   Brock Badharles A Jahlani Lorentz, MDPatient ID: Tera Helperhelsea D Keleher, female   DOB: 1990/04/24, 26 y.o.   MRN: [id number]

## 2016-06-19 NOTE — Progress Notes (Signed)
Patient is in the office, reports good fetal movement, denies contractions. 

## 2016-06-20 LAB — CERVICOVAGINAL ANCILLARY ONLY
Bacterial vaginitis: NEGATIVE
CHLAMYDIA, DNA PROBE: NEGATIVE
Candida vaginitis: POSITIVE — AB
NEISSERIA GONORRHEA: NEGATIVE
Trichomonas: NEGATIVE

## 2016-06-21 ENCOUNTER — Other Ambulatory Visit: Payer: Self-pay | Admitting: Obstetrics

## 2016-06-21 DIAGNOSIS — B373 Candidiasis of vulva and vagina: Secondary | ICD-10-CM

## 2016-06-21 DIAGNOSIS — B3731 Acute candidiasis of vulva and vagina: Secondary | ICD-10-CM

## 2016-06-21 LAB — STREP GP B NAA: Strep Gp B NAA: NEGATIVE

## 2016-06-21 MED ORDER — FLUCONAZOLE 150 MG PO TABS: 150.0000 mg | ORAL_TABLET | Freq: Once | ORAL | 0 refills | Status: AC

## 2016-06-26 ENCOUNTER — Ambulatory Visit (INDEPENDENT_AMBULATORY_CARE_PROVIDER_SITE_OTHER): Payer: Medicaid Other | Admitting: Obstetrics

## 2016-06-26 ENCOUNTER — Encounter: Payer: Self-pay | Admitting: Obstetrics

## 2016-06-26 DIAGNOSIS — Z3480 Encounter for supervision of other normal pregnancy, unspecified trimester: Secondary | ICD-10-CM

## 2016-06-26 DIAGNOSIS — Z3483 Encounter for supervision of other normal pregnancy, third trimester: Secondary | ICD-10-CM

## 2016-06-26 DIAGNOSIS — O34219 Maternal care for unspecified type scar from previous cesarean delivery: Secondary | ICD-10-CM

## 2016-06-26 NOTE — Progress Notes (Signed)
Subjective:  Tera HelperChelsea D Lonon is a 26 y.o. G2P1001 at 3077w2d being seen today for ongoing prenatal care.  She is currently monitored for the following issues for this low-risk pregnancy and has Eczema; Supervision of normal pregnancy, antepartum; and H/O cesarean section on her problem list.  Patient reports tenderness at previous C/S incision.  Contractions: Not present. Vag. Bleeding: None.  Movement: Present. Denies leaking of fluid.   The following portions of the patient's history were reviewed and updated as appropriate: allergies, current medications, past family history, past medical history, past social history, past surgical history and problem list. Problem list updated.  Objective:   Vitals:   06/26/16 1105  BP: 122/76  Pulse: (!) 103  Weight: 214 lb (97.1 kg)    Fetal Status: Fetal Heart Rate (bpm): 150   Movement: Present     General:  Alert, oriented and cooperative. Patient is in no acute distress.  Skin: Skin is warm and dry. No rash noted.   Cardiovascular: Normal heart rate noted  Respiratory: Normal respiratory effort, no problems with respiration noted  Abdomen: Soft, gravid, appropriate for gestational age. Pain/Pressure: Present     Pelvic:  Cervical exam deferred        Extremities: Normal range of motion.  Edema: Trace  Mental Status: Normal mood and affect. Normal behavior. Normal judgment and thought content.   Urinalysis:      Assessment and Plan:  1. Pregnancy: G2P1001 at 4777w2d 2. Previous Cesarean Section.  Would like TOLAC - VBAC Consent signed today   There are no diagnoses linked to this encounter. Term labor symptoms and general obstetric precautions including but not limited to vaginal bleeding, contractions, leaking of fluid and fetal movement were reviewed in detail with the patient. Please refer to After Visit Summary for other counseling recommendations.  Return in 1 week (on 07/03/2016).   Brock Badharles A Harper, MDPatient ID: Tera Helperhelsea D Skare, female    DOB: 1990/08/14, 26 y.o.   MRN: [id number]

## 2016-06-26 NOTE — Progress Notes (Signed)
Pt presents for ROB c/o soreness near c/s site.

## 2016-07-03 ENCOUNTER — Ambulatory Visit (INDEPENDENT_AMBULATORY_CARE_PROVIDER_SITE_OTHER): Payer: Medicaid Other | Admitting: Obstetrics

## 2016-07-03 ENCOUNTER — Encounter: Payer: Self-pay | Admitting: Obstetrics

## 2016-07-03 VITALS — BP 125/79 | HR 107 | Wt 214.0 lb

## 2016-07-03 DIAGNOSIS — Z3480 Encounter for supervision of other normal pregnancy, unspecified trimester: Secondary | ICD-10-CM

## 2016-07-03 NOTE — Progress Notes (Addendum)
Subjective:  Holly Glass is a 26 y.o. G2P1001 at [redacted]w[redacted]d being seen today for ongoing prenatal care.  She is currently monitored for the following issues for this high-risk pregnancy and has Eczema; Supervision of normal pregnancy, antepartum; and H/O cesarean section on her problem list.  Desires TOLAC.  Patient reports no complaints.  Contractions: Not present. Vag. Bleeding: None.  Movement: Present. Denies leaking of fluid.   The following portions of the patient's history were reviewed and updated as appropriate: allergies, current medications, past family history, past medical history, past social history, past surgical history and problem list. Problem list updated.  Objective:   Vitals:   07/03/16 1044  BP: 125/79  Pulse: (!) 107  Weight: 214 lb (97.1 kg)    Fetal Status: Fetal Heart Rate (bpm): 150   Movement: Present     General:  Alert, oriented and cooperative. Patient is in no acute distress.  Skin: Skin is warm and dry. No rash noted.   Cardiovascular: Normal heart rate noted  Respiratory: Normal respiratory effort, no problems with respiration noted  Abdomen: Soft, gravid, appropriate for gestational age. Pain/Pressure: Present     Pelvic:  Cervical exam deferred        Extremities: Normal range of motion.  Edema: Trace  Mental Status: Normal mood and affect. Normal behavior. Normal judgment and thought content.   Urinalysis:      Assessment and Plan:  Pregnancy: G2P1001 at [redacted]w[redacted]d History of C/S for NRFHR.  Desires TOLAC.  Consent signed.  There are no diagnoses linked to this encounter. Term labor symptoms and general obstetric precautions including but not limited to vaginal bleeding, contractions, leaking of fluid and fetal movement were reviewed in detail with the patient. Please refer to After Visit Summary for other counseling recommendations.  Return in 1 week (on 07/10/2016).   Brock Bad, MD

## 2016-07-09 ENCOUNTER — Ambulatory Visit (INDEPENDENT_AMBULATORY_CARE_PROVIDER_SITE_OTHER): Payer: Medicaid Other | Admitting: Obstetrics and Gynecology

## 2016-07-09 DIAGNOSIS — O34219 Maternal care for unspecified type scar from previous cesarean delivery: Secondary | ICD-10-CM

## 2016-07-09 DIAGNOSIS — Z3483 Encounter for supervision of other normal pregnancy, third trimester: Secondary | ICD-10-CM

## 2016-07-09 DIAGNOSIS — Z98891 History of uterine scar from previous surgery: Secondary | ICD-10-CM

## 2016-07-09 DIAGNOSIS — Z3480 Encounter for supervision of other normal pregnancy, unspecified trimester: Secondary | ICD-10-CM

## 2016-07-09 NOTE — Progress Notes (Signed)
   PRENATAL VISIT NOTE  Subjective:  Holly Glass is a 26 y.o. G2P1001 at [redacted]w[redacted]d being seen today for ongoing prenatal care.  She is currently monitored for the following issues for this low-risk pregnancy and has Eczema; Supervision of normal pregnancy, antepartum; and H/O cesarean section on her problem list.  Patient reports no complaints.  Contractions: Not present. Vag. Bleeding: None.  Movement: Present. Denies leaking of fluid.   The following portions of the patient's history were reviewed and updated as appropriate: allergies, current medications, past family history, past medical history, past social history, past surgical history and problem list. Problem list updated.  Objective:   Vitals:   07/09/16 1307  BP: 124/79  Pulse: (!) 116  Weight: 214 lb (97.1 kg)    Fetal Status: Fetal Heart Rate (bpm): 146   Movement: Present     General:  Alert, oriented and cooperative. Patient is in no acute distress.  Skin: Skin is warm and dry. No rash noted.   Cardiovascular: Normal heart rate noted  Respiratory: Normal respiratory effort, no problems with respiration noted  Abdomen: Soft, gravid, appropriate for gestational age. Pain/Pressure: Present     Pelvic:  Cervical exam deferred        Extremities: Normal range of motion.  Edema: Trace  Mental Status: Normal mood and affect. Normal behavior. Normal judgment and thought content.   Assessment and Plan:  Pregnancy: G2P1001 at [redacted]w[redacted]d  1. Supervision of other normal pregnancy, antepartum Patient is doing well without complaints Answered questions regarding anticipated delivery and possible induction of labor if needed  2. H/O cesarean section Desires TOLAC- consent signed  Term labor symptoms and general obstetric precautions including but not limited to vaginal bleeding, contractions, leaking of fluid and fetal movement were reviewed in detail with the patient. Please refer to After Visit Summary for other counseling  recommendations.  Return in about 1 week (around 07/16/2016) for ROB.   Catalina Antigua, MD

## 2016-07-09 NOTE — Progress Notes (Signed)
Pt presents for ROB no concerns today per pt.  °

## 2016-07-10 ENCOUNTER — Encounter: Payer: Self-pay | Admitting: Obstetrics

## 2016-07-17 ENCOUNTER — Encounter: Payer: Self-pay | Admitting: Obstetrics

## 2016-07-17 ENCOUNTER — Inpatient Hospital Stay (HOSPITAL_COMMUNITY)
Admission: AD | Admit: 2016-07-17 | Discharge: 2016-07-17 | Disposition: A | Discharge status: Home / Self Care | Payer: Medicaid Other | Source: Ambulatory Visit | Attending: Obstetrics & Gynecology | Admitting: Obstetrics & Gynecology

## 2016-07-17 ENCOUNTER — Encounter (HOSPITAL_COMMUNITY): Payer: Self-pay

## 2016-07-17 ENCOUNTER — Ambulatory Visit (INDEPENDENT_AMBULATORY_CARE_PROVIDER_SITE_OTHER): Payer: Medicaid Other | Admitting: Obstetrics

## 2016-07-17 VITALS — BP 134/80 | HR 112 | Wt 217.0 lb

## 2016-07-17 DIAGNOSIS — Z8249 Family history of ischemic heart disease and other diseases of the circulatory system: Secondary | ICD-10-CM | POA: Insufficient documentation

## 2016-07-17 DIAGNOSIS — Z888 Allergy status to other drugs, medicaments and biological substances status: Secondary | ICD-10-CM | POA: Diagnosis not present

## 2016-07-17 DIAGNOSIS — O471 False labor at or after 37 completed weeks of gestation: Secondary | ICD-10-CM

## 2016-07-17 DIAGNOSIS — Z3A39 39 weeks gestation of pregnancy: Secondary | ICD-10-CM | POA: Diagnosis not present

## 2016-07-17 DIAGNOSIS — N898 Other specified noninflammatory disorders of vagina: Secondary | ICD-10-CM | POA: Diagnosis present

## 2016-07-17 DIAGNOSIS — Z3483 Encounter for supervision of other normal pregnancy, third trimester: Secondary | ICD-10-CM

## 2016-07-17 DIAGNOSIS — Z3480 Encounter for supervision of other normal pregnancy, unspecified trimester: Secondary | ICD-10-CM

## 2016-07-17 LAB — POCT FERN TEST: POCT FERN TEST: NEGATIVE

## 2016-07-17 NOTE — MAU Note (Signed)
Pt reports she has has changed her underwear 2 times in the last 30 min. Reports mild ctx occasionally. Good fetal movement. Planning a v-back

## 2016-07-17 NOTE — MAU Provider Note (Signed)
Obstetric Attending MAU Note  Chief Complaint:  Rupture of Membranes   First Provider Initiated Contact with Patient 07/17/16 1727     HPI: Holly Glass is a 26 y.o. G2P1001 at [redacted]w[redacted]d who presents to maternity admissions reporting possible ROM about 45 minutes ago.  Had small amount of clear fluid per vagina, thinks it could be just vaginal discharge but wanted to make sure.  Denies contractions or vaginal bleeding. Good fetal movement.   Pregnancy Course: Receives care at [facility] Patient Active Problem List   Diagnosis Date Noted  . Supervision of normal pregnancy, antepartum 01/16/2016  . H/O cesarean section 01/16/2016  . Eczema 02/03/2015    Past Medical History:  Diagnosis Date  . Eczema   . Medical history non-contributory     OB History  Gravida Para Term Preterm AB Living  SAB TAB Ectopic Multiple Live Births        0 1    # Outcome Date GA Lbr Len/2nd Weight Sex Delivery Anes PTL Lv  2 Current           1 Term 02/03/15 [redacted]w[redacted]d  7 lb 13.4 oz (3.555 kg) M CS-LTranv Spinal  LIV      Past Surgical History:  Procedure Laterality Date  . addenoidectomy    . CESAREAN SECTION N/A 02/03/2015   Procedure: CESAREAN SECTION;  Surgeon: Jaymes Graff, MD;  Location: [facility];  Service: Obstetrics;  Laterality: N/A;  . EYE SURGERY    . TONSILLECTOMY      Family History: Family History  Problem Relation Age of Onset  . Arthritis Maternal Grandmother   . Heart disease Maternal Grandmother   . Miscarriages / Stillbirths Maternal Grandmother   . Cancer Maternal Grandfather   . Alcohol abuse Paternal Grandmother   . Hypertension Mother   . Hypertension Father   . Heart murmur Sister   . Birth defects Maternal Uncle     short leg    Social History: Social History  Substance Use Topics  . Smoking status: Never Smoker  . Smokeless tobacco: Never Used  . Alcohol use No    Allergies:  Allergies  Allergen Reactions  . Zyrtec Allergy [Cetirizine Hcl] Hives     Only Liquid zyrtec gives allergy    Prescriptions Prior to Admission  Medication Sig Dispense Refill Last Dose  . prenatal vitamin w/FE, FA (PRENATAL 1 + 1) 27-1 MG TABS tablet Take 1 tablet by mouth daily at 12 noon.   Taking    ROS: Pertinent findings in history of present illness.  Physical Exam  Blood pressure (!) 149/87, pulse 94, temperature 98.8 F (37.1 C), resp. rate 18, height 5' (1.524 m), weight 219 lb 6.4 oz (99.5 kg), last menstrual period 10/28/2015, unknown if currently breastfeeding. CONSTITUTIONAL: Well-developed, well-nourished female in no acute distress.  HENT:  Normocephalic, atraumatic, External right and left ear normal. Oropharynx is clear and moist EYES: Conjunctivae and EOM are normal. Pupils are equal, round, and reactive to light. No scleral icterus.  NECK: Normal range of motion, supple, no masses SKIN: Skin is warm and dry. No rash noted. Not diaphoretic. No erythema. No pallor. NEUROLGIC: Alert and oriented to person, place, and time. Normal reflexes, muscle tone coordination. No cranial nerve deficit noted. PSYCHIATRIC: Normal mood and affect. Normal behavior. Normal judgment and thought content. CARDIOVASCULAR: Normal heart rate noted, regular rhythm RESPIRATORY: Effort and breath sounds normal, no problems with respiration noted ABDOMEN: Soft, nontender,  nondistended, gravid appropriate for gestational age MUSCULOSKELETAL: Normal range of motion. No edema and no tenderness. 2+ distal pulses.  SPECULUM EXAM: NEFG, physiologic discharge, no blood, cervix clean, no pool, no fern Dilation: Closed Exam by:: Dr. Macon Large   FHT:  Baseline 155 , moderate variability, accelerations present, no decelerations Contractions: rare   Labs: Results for orders placed or performed during the hospital encounter of 07/17/16 (from the past 24 hour(s))  Fern Test     Status: None   Collection Time: 07/17/16  5:33 PM  Result Value Ref Range   POCT Fern Test  Negative = intact amniotic membranes     Imaging:  No results found.  MAU Course: Negative pooling and ferning.  Assessment: 1. False labor after 37 completed weeks of gestation     Plan: Discharge home Labor precautions and fetal kick counts reviewed Follow up with OB provider as scheduled.  Follow-up Information    [facility] [place] Follow up on 07/24/2016.   Specialty:  Obstetrics and Gynecology Why:  2 pm for prenatal appointment Contact information: [address] [place] [address] [phone]          Allergies as of 07/17/2016      Reactions   Zyrtec Allergy [cetirizine Hcl] Hives   Only Liquid zyrtec gives allergy      Medication List    TAKE these medications   prenatal vitamin w/FE, FA 27-1 MG Tabs tablet Take 1 tablet by mouth daily at 12 noon.       Tereso Newcomer, MD 07/17/2016 5:41 PM

## 2016-07-17 NOTE — Discharge Instructions (Signed)

## 2016-07-17 NOTE — Progress Notes (Signed)
Subjective:  Holly Glass is a 26 y.o. G2P1001 at [redacted]w[redacted]d being seen today for ongoing prenatal care.  She is currently monitored for the following issues for this low-risk pregnancy and has Eczema; Supervision of normal pregnancy, antepartum; and H/O cesarean section on her problem list.  Patient reports occasional contractions.  Contractions: Irregular.  .  Movement: Present. Denies leaking of fluid.   The following portions of the patient's history were reviewed and updated as appropriate: allergies, current medications, past family history, past medical history, past social history, past surgical history and problem list. Problem list updated.  Objective:   Vitals:   07/17/16 1036  BP: 134/80  Pulse: (!) 112  Weight: 217 lb (98.4 kg)    Fetal Status:     Movement: Present     General:  Alert, oriented and cooperative. Patient is in no acute distress.  Skin: Skin is warm and dry. No rash noted.   Cardiovascular: Normal heart rate noted  Respiratory: Normal respiratory effort, no problems with respiration noted  Abdomen: Soft, gravid, appropriate for gestational age. Pain/Pressure: Present     Pelvic:  Cvx: 1-2 cm / Long / -3 / Vtx  Extremities: Normal range of motion.     Mental Status: Normal mood and affect. Normal behavior. Normal judgment and thought content.   Urinalysis:      Assessment and Plan:  Pregnancy: G2P1001 at [redacted]w[redacted]d  There are no diagnoses linked to this encounter. Term labor symptoms and general obstetric precautions including but not limited to vaginal bleeding, contractions, leaking of fluid and fetal movement were reviewed in detail with the patient. Please refer to After Visit Summary for other counseling recommendations.  No Follow-up on file.   Brock Bad, MDPatient ID: Holly Glass, female   DOB: [birth year], 26 y.o.   MRN: [id number]

## 2016-07-21 LAB — OB RESULTS CONSOLE GBS: STREP GROUP B AG: NEGATIVE

## 2016-07-24 ENCOUNTER — Ambulatory Visit (INDEPENDENT_AMBULATORY_CARE_PROVIDER_SITE_OTHER): Payer: Medicaid Other | Admitting: Obstetrics and Gynecology

## 2016-07-24 ENCOUNTER — Telehealth (HOSPITAL_COMMUNITY): Payer: Self-pay | Admitting: *Deleted

## 2016-07-24 VITALS — BP 124/80 | HR 112 | Wt 220.4 lb

## 2016-07-24 DIAGNOSIS — Z3483 Encounter for supervision of other normal pregnancy, third trimester: Secondary | ICD-10-CM

## 2016-07-24 DIAGNOSIS — Z3480 Encounter for supervision of other normal pregnancy, unspecified trimester: Secondary | ICD-10-CM

## 2016-07-24 DIAGNOSIS — O34219 Maternal care for unspecified type scar from previous cesarean delivery: Secondary | ICD-10-CM

## 2016-07-24 DIAGNOSIS — Z98891 History of uterine scar from previous surgery: Secondary | ICD-10-CM

## 2016-07-24 NOTE — Progress Notes (Signed)
Subjective:  Holly Glass is a 26 y.o. G2P1001 at [redacted]w[redacted]d being seen today for ongoing prenatal care.  She is currently monitored for the following issues for this low-risk pregnancy and has Eczema; Supervision of normal pregnancy, antepartum; and H/O cesarean section on her problem list.  Patient reports no complaints.  Contractions: Irregular. Vag. Bleeding: None.  Movement: Present. Denies leaking of fluid.   The following portions of the patient's history were reviewed and updated as appropriate: allergies, current medications, past family history, past medical history, past social history, past surgical history and problem list. Problem list updated.  Objective:   Vitals:   07/24/16 1405  BP: 124/80  Pulse: (!) 112  Weight: 220 lb 6.4 oz (100 kg)    Fetal Status: Fetal Heart Rate (bpm): NST   Movement: Present     General:  Alert, oriented and cooperative. Patient is in no acute distress.  Skin: Skin is warm and dry. No rash noted.   Cardiovascular: Normal heart rate noted  Respiratory: Normal respiratory effort, no problems with respiration noted  Abdomen: Soft, gravid, appropriate for gestational age. Pain/Pressure: Absent     Pelvic:  Cervical exam deferred        Extremities: Normal range of motion.  Edema: Trace  Mental Status: Normal mood and affect. Normal behavior. Normal judgment and thought content.   Urinalysis:      Assessment and Plan:  Pregnancy: G2P1001 at [redacted]w[redacted]d  1. Supervision of other normal pregnancy, antepartum Labor precautions - Fetal nonstress test IOL on May 1 2. H/O cesarean section VBAC consent  Term labor symptoms and general obstetric precautions including but not limited to vaginal bleeding, contractions, leaking of fluid and fetal movement were reviewed in detail with the patient. Please refer to After Visit Summary for other counseling recommendations.  Return in about 6 weeks (around 09/04/2016).   Hermina Staggers, MD

## 2016-07-24 NOTE — Patient Instructions (Signed)

## 2016-07-24 NOTE — Progress Notes (Signed)
Induction scheduled for 0730 on 07/29/16 - Please let patient know at today's visit

## 2016-07-24 NOTE — Progress Notes (Signed)
Patient reports good fetal movement, denies pain. 

## 2016-07-24 NOTE — Telephone Encounter (Signed)
Preadmission screen  

## 2016-07-25 ENCOUNTER — Other Ambulatory Visit: Payer: Self-pay | Admitting: Advanced Practice Midwife

## 2016-07-25 ENCOUNTER — Telehealth: Payer: Self-pay | Admitting: *Deleted

## 2016-07-25 ENCOUNTER — Other Ambulatory Visit: Payer: Self-pay | Admitting: *Deleted

## 2016-07-25 DIAGNOSIS — B3731 Acute candidiasis of vulva and vagina: Secondary | ICD-10-CM

## 2016-07-25 DIAGNOSIS — B373 Candidiasis of vulva and vagina: Secondary | ICD-10-CM

## 2016-07-25 MED ORDER — FLUCONAZOLE 150 MG PO TABS: 150.0000 mg | ORAL_TABLET | Freq: Once | ORAL | 0 refills | Status: AC

## 2016-07-25 NOTE — Telephone Encounter (Signed)
9:48 Returned call- patient is having "cottage cheese" discharge and itching. She is requesting treatment. Patient treated per protocol and she is to follow up on office if she does not improve.

## 2016-07-25 NOTE — Telephone Encounter (Signed)
Patient is calling to request yeast medication- she wants to know if she can have something called in or if she needs an appointment. 9:20 Attempted to call patient- no answer and mailbox full.

## 2016-07-25 NOTE — Progress Notes (Unsigned)
Diflucan Rx 

## 2016-07-29 ENCOUNTER — Inpatient Hospital Stay (HOSPITAL_COMMUNITY)
Admission: RE | Admit: 2016-07-29 | Discharge: 2016-08-01 | DRG: 765 | Disposition: A | Discharge status: Home / Self Care | Payer: Medicaid Other | Source: Ambulatory Visit | Attending: Obstetrics and Gynecology | Admitting: Obstetrics and Gynecology

## 2016-07-29 ENCOUNTER — Encounter (HOSPITAL_COMMUNITY): Payer: Self-pay

## 2016-07-29 VITALS — BP 110/72 | HR 67 | Temp 97.4°F | Resp 18 | Ht 60.0 in | Wt 220.0 lb

## 2016-07-29 DIAGNOSIS — O480 Post-term pregnancy: Secondary | ICD-10-CM | POA: Diagnosis present

## 2016-07-29 DIAGNOSIS — Z6841 Body Mass Index (BMI) 40.0 and over, adult: Secondary | ICD-10-CM | POA: Diagnosis not present

## 2016-07-29 DIAGNOSIS — Z3A41 41 weeks gestation of pregnancy: Secondary | ICD-10-CM | POA: Diagnosis not present

## 2016-07-29 DIAGNOSIS — O34211 Maternal care for low transverse scar from previous cesarean delivery: Secondary | ICD-10-CM | POA: Diagnosis present

## 2016-07-29 DIAGNOSIS — O99214 Obesity complicating childbirth: Secondary | ICD-10-CM | POA: Diagnosis present

## 2016-07-29 DIAGNOSIS — Z3480 Encounter for supervision of other normal pregnancy, unspecified trimester: Secondary | ICD-10-CM

## 2016-07-29 DIAGNOSIS — O894 Spinal and epidural anesthesia-induced headache during the puerperium: Secondary | ICD-10-CM | POA: Diagnosis not present

## 2016-07-29 DIAGNOSIS — Z98891 History of uterine scar from previous surgery: Secondary | ICD-10-CM

## 2016-07-29 LAB — CBC
HCT: 37.7 % (ref 36.0–46.0)
HEMOGLOBIN: 12.3 g/dL (ref 12.0–15.0)
MCH: 27.2 pg (ref 26.0–34.0)
MCHC: 32.6 g/dL (ref 30.0–36.0)
MCV: 83.4 fL (ref 78.0–100.0)
Platelets: 193 10*3/uL (ref 150–400)
RBC: 4.52 MIL/uL (ref 3.87–5.11)
RDW: 13.8 % (ref 11.5–15.5)
WBC: 9.1 10*3/uL (ref 4.0–10.5)

## 2016-07-29 LAB — TYPE AND SCREEN
ABO/RH(D): O POS
Antibody Screen: NEGATIVE

## 2016-07-29 MED ORDER — LACTATED RINGERS IV SOLN
INTRAVENOUS | Status: DC
  Administered 2016-07-29 – 2016-07-30 (×2): via INTRAVENOUS

## 2016-07-29 MED ORDER — LACTATED RINGERS IV SOLN
500.0000 mL | INTRAVENOUS | Status: DC | PRN
  Administered 2016-07-29 (×2): 500 mL via INTRAVENOUS

## 2016-07-29 MED ORDER — OXYCODONE-ACETAMINOPHEN 5-325 MG PO TABS: 2.0000 | ORAL_TABLET | ORAL | Status: DC | PRN

## 2016-07-29 MED ORDER — OXYTOCIN BOLUS FROM INFUSION: 500.0000 mL | Freq: Once | INTRAVENOUS | Status: DC

## 2016-07-29 MED ORDER — TERBUTALINE SULFATE 1 MG/ML IJ SOLN
0.2500 mg | Freq: Once | INTRAMUSCULAR | Status: AC | PRN
  Administered 2016-07-29: 0.25 mg via SUBCUTANEOUS
  Filled 2016-07-29: qty 1

## 2016-07-29 MED ORDER — LIDOCAINE HCL (PF) 1 % IJ SOLN: 30.0000 mL | INTRAMUSCULAR | Status: DC | PRN

## 2016-07-29 MED ORDER — ZOLPIDEM TARTRATE 5 MG PO TABS
5.0000 mg | ORAL_TABLET | Freq: Every evening | ORAL | Status: DC | PRN
  Filled 2016-07-29: qty 1

## 2016-07-29 MED ORDER — OXYCODONE-ACETAMINOPHEN 5-325 MG PO TABS: 1.0000 | ORAL_TABLET | ORAL | Status: DC | PRN

## 2016-07-29 MED ORDER — OXYTOCIN 40 UNITS IN LACTATED RINGERS INFUSION - SIMPLE MED
1.0000 m[IU]/min | INTRAVENOUS | Status: DC
  Administered 2016-07-29: 2 m[IU]/min via INTRAVENOUS
  Administered 2016-07-29: 1 m[IU]/min via INTRAVENOUS

## 2016-07-29 MED ORDER — SOD CITRATE-CITRIC ACID 500-334 MG/5ML PO SOLN
30.0000 mL | ORAL | Status: DC | PRN
  Administered 2016-07-30: 30 mL via ORAL
  Filled 2016-07-29: qty 15

## 2016-07-29 MED ORDER — FENTANYL CITRATE (PF) 100 MCG/2ML IJ SOLN
100.0000 ug | INTRAMUSCULAR | Status: DC | PRN
  Administered 2016-07-29 (×2): 100 ug via INTRAVENOUS
  Filled 2016-07-29 (×2): qty 2

## 2016-07-29 MED ORDER — ONDANSETRON HCL 4 MG/2ML IJ SOLN
4.0000 mg | Freq: Four times a day (QID) | INTRAMUSCULAR | Status: DC | PRN
Start: 2016-07-29 — End: 2016-07-30

## 2016-07-29 MED ORDER — OXYTOCIN 40 UNITS IN LACTATED RINGERS INFUSION - SIMPLE MED
2.5000 [IU]/h | INTRAVENOUS | Status: DC
  Filled 2016-07-29: qty 1000

## 2016-07-29 MED ORDER — ACETAMINOPHEN 325 MG PO TABS: 650.0000 mg | ORAL_TABLET | ORAL | Status: DC | PRN

## 2016-07-29 NOTE — Anesthesia Pain Management Evaluation Note (Signed)
  [facility] Pain Management Visit Note  Patient: Holly Glass, 26 y.o., female  "Hello I am a member of the anesthesia team at [facility]. We have an anesthesia team available at all times to provide care throughout the hospital, including epidural management and anesthesia for C-section. I don't know your plan for the delivery whether it a natural birth, water birth, IV sedation, nitrous supplementation, doula or epidural, but we want to meet your pain goals."   1.Was your pain managed to your expectations on prior hospitalizations?   Yes   2.What is your expectation for pain management during this hospitalization?     Epidural  3.How can we help you reach that goal?   Record the patient's initial score and the patient's pain goal.   Pain: 4  Pain Goal: 8 [facility] wants you to be able to say your pain was always managed very well.  Laban Emperor 07/29/2016

## 2016-07-29 NOTE — Progress Notes (Signed)
LABOR PROGRESS NOTE  Holly Glass is a 26 y.o. G2P1001 at [redacted]w[redacted]d  admitted for  IOL for postdates  Subjective: Patient is feeling more pressure in her pelvis but is comfortable at the moment.  Objective: BP (!) 126/58   Pulse 81   Temp 98.5 F (36.9 C) (Oral)   Resp 18   Ht 5' (1.524 m)   Wt 220 lb (99.8 kg)   LMP 10/28/2015 (Approximate)   BMI 42.97 kg/m  or  Vitals:   07/29/16 1131 07/29/16 1201 07/29/16 1231 07/29/16 1303  BP: 113/67 110/86 115/84 (!) 126/58  Pulse: 93 (!) 103 90 81  Resp: Temp:  98.5 F (36.9 C)    TempSrc:  Oral    Weight:      Height:        Dilation: 2 Effacement (%): 70 Cervical Position: Middle Station: -2 Presentation: Vertex Exam by::  (Resident)  Labs: Lab Results  Component Value Date   WBC 9.1 07/29/2016   HGB 12.3 07/29/2016   HCT 37.7 07/29/2016   MCV 83.4 07/29/2016   PLT 193 07/29/2016    Patient Active Problem List   Diagnosis Date Noted  . Post term pregnancy at [redacted] weeks gestation 07/29/2016  . Supervision of normal pregnancy, antepartum 01/16/2016  . H/O cesarean section 01/16/2016  . Eczema 02/03/2015    Assessment / Plan: 26 y.o. G2P1001 at [redacted]w[redacted]d here for IOL for postdates. Continue with pitocin and foley bulb.  Labor: continue with augmentation protocol Fetal Wellbeing: Category 1 Pain Control:  IV pain meds, no epidural Anticipated MOD:  Vaginal  Lovena Neighbours, MD 07/29/2016, 1:30 PM

## 2016-07-29 NOTE — H&P (Signed)
LABOR AND DELIVERY ADMISSION HISTORY AND PHYSICAL NOTE  Holly Glass is a 26 y.o. female G2P1001 with IUP at [redacted]w[redacted]d by [place] 13.6 weeks by [place] presenting for IOL. Patient desires TOLAC but  Has signed papers for VBAC on 06/26/2016. Patient has a history of C-section 2/2 fetal intolerance of labor/oligo. Patient had an uncomplicated prenatal care.   She reports positive fetal movement. She denies leakage of fluid or vaginal bleeding.  Prenatal History/Complications:  Past Medical History: Past Medical History:  Diagnosis Date  . Eczema   . Medical history non-contributory     Past Surgical History: Past Surgical History:  Procedure Laterality Date  . addenoidectomy    . CESAREAN SECTION N/A 02/03/2015   Procedure: CESAREAN SECTION;  Surgeon: Jaymes Graff, MD;  Location: [facility];  Service: Obstetrics;  Laterality: N/A;  . EYE SURGERY    . TONSILLECTOMY      Obstetrical History: OB History    Gravida Para Term Preterm AB Living   SAB TAB Ectopic Multiple Live Births         0 1      Social History: Social History   Social History  . Marital status: Single    Spouse name: N/A  . Number of children: N/A  . Years of education: N/A   Social History Main Topics  . Smoking status: Never Smoker  . Smokeless tobacco: Never Used  . Alcohol use No  . Drug use: No  . Sexual activity: Yes   Other Topics Concern  . None   Social History Narrative  . None    Family History: Family History  Problem Relation Age of Onset  . Arthritis Maternal Grandmother   . Heart disease Maternal Grandmother   . Miscarriages / Stillbirths Maternal Grandmother   . Cancer Maternal Grandfather   . Alcohol abuse Paternal Grandmother   . Hypertension Mother   . Hypertension Father   . Heart murmur Sister   . Birth defects Maternal Uncle     short leg    Allergies: Allergies  Allergen Reactions  . Zyrtec Allergy [Cetirizine Hcl] Hives    Only Liquid zyrtec gives allergy     Prescriptions Prior to Admission  Medication Sig Dispense Refill Last Dose  . prenatal vitamin w/FE, FA (PRENATAL 1 + 1) 27-1 MG TABS tablet Take 1 tablet by mouth daily at 12 noon.   07/28/2016 at Unknown time     Review of Systems   All systems reviewed and negative except as stated in HPI  Blood pressure 133/80, pulse (!) 106, temperature 98.6 F (37 C), temperature source Axillary, resp. rate 18, height 5' (1.524 m), weight 220 lb (99.8 kg), last menstrual period 10/28/2015, unknown if currently breastfeeding. General appearance: alert, cooperative and appears stated age Lungs: Normal respiratory effort, no audible wheezing Heart: regular rate and pulses palpated bilaterally  Abdomen: soft, non-tender, gravid abdomen appropriate for gestational age Extremities: No calf swelling or tenderness Presentation: cephalic by nurse exam Fetal monitoring: FHR: 140, moderate variability, +accels, no decels Uterine activity: undetectable Dilation: Fingertip Station: -3 Exam by:: Kris Hartmann   Prenatal labs: ABO, Rh: O/Positive/-- (10/18 1428) Antibody: Negative (10/18 1428) Rubella:  Immune  RPR: Non Reactive (02/01 0930)  HBsAg: Negative (10/18 1428)  HIV: Non Reactive (02/01 0930)  GBS: Negative (03/22 1116)  1 hr Glucola: 61 Genetic screening: Declined Anatomy US: Normal  Prenatal Transfer Tool  Maternal Diabetes: No Genetic Screening:  Declined Maternal Ultrasounds/Referrals: Normal Fetal Ultrasounds or other Referrals:  None Maternal Substance Abuse:  No Significant Maternal Medications:  None Significant Maternal Lab Results: Lab values include: Group B Strep negative  Results for orders placed or performed during the hospital encounter of 07/29/16 (from the past 24 hour(s))  CBC   Collection Time: 07/29/16  8:22 AM  Result Value Ref Range   WBC 9.1 4.0 - 10.5 K/uL   RBC 4.52 3.87 - 5.11 MIL/uL   Hemoglobin 12.3 12.0 - 15.0 g/dL   HCT 69.6 29.5 - 28.4 %   MCV  83.4 78.0 - 100.0 fL   MCH 27.2 26.0 - 34.0 pg   MCHC 32.6 30.0 - 36.0 g/dL   RDW 13.2 44.0 - 10.2 %   Platelets 193 150 - 400 K/uL    Patient Active Problem List   Diagnosis Date Noted  . Post term pregnancy at [redacted] weeks gestation 07/29/2016  . Supervision of normal pregnancy, antepartum 01/16/2016  . H/O cesarean section 01/16/2016  . Eczema 02/03/2015    Assessment: Holly Glass is a 26 y.o. G2P1001 at [redacted]w[redacted]d here for IOL for postdates.   #Labor: Induction with pitocin and foley bulb was placed on admission. #Pain: IV pain meds, epidural at patient request #FWB: Cat 1 #ID:  GBS negative #MOF: Breast #MOC: Family planning #Circ:  Outpatient  Abdoulaye Diallo, PGY-1 07/29/2016, 9:10 AM   OB FELLOW HISTORY AND PHYSICAL ATTESTATION  I have seen and examined this patient; I agree with above documentation in the resident's note. Induction for post dates pregnancy. FB was placed on admission, starting pitocin. Cat I FHT.   Jen Mow, DO [place] Fellow 07/29/2016

## 2016-07-29 NOTE — Progress Notes (Signed)
Patient ID: Holly Glass, female   DOB: 09-May-1990, 26 y.o.   MRN: [id number]  S: Patient seen & examined for progress of labor. Patient comfortable but feeling contractions.    O:  Vitals:   07/29/16 1803 07/29/16 1821 07/29/16 1853 07/29/16 1903  BP:  126/73  (!) 153/66  Pulse:  94  86  Resp: Temp:      TempSrc:      Weight:      Height:        Dilation: 6 Effacement (%): 70 Cervical Position: Middle Station: -3 Presentation: Vertex Exam by:: Mumaw   FHT: 160 bpm, mod var, no accels, occasional variable decels with return to baseline TOCO: Occasional >14min  AROM performed, scant fluid/mucous returned. IUPC placed.   A/P: AROM performed IUPC placed Holly Glass bolus Pitocin off Continue expectant management Anticipate SVD

## 2016-07-30 ENCOUNTER — Inpatient Hospital Stay (HOSPITAL_COMMUNITY): Payer: Medicaid Other | Admitting: Anesthesiology

## 2016-07-30 ENCOUNTER — Encounter (HOSPITAL_COMMUNITY)
Admission: RE | Disposition: A | Discharge status: Home / Self Care | Payer: Self-pay | Source: Ambulatory Visit | Attending: Obstetrics and Gynecology

## 2016-07-30 ENCOUNTER — Encounter (HOSPITAL_COMMUNITY): Payer: Self-pay

## 2016-07-30 DIAGNOSIS — Z3A41 41 weeks gestation of pregnancy: Secondary | ICD-10-CM

## 2016-07-30 DIAGNOSIS — O34211 Maternal care for low transverse scar from previous cesarean delivery: Secondary | ICD-10-CM

## 2016-07-30 DIAGNOSIS — O480 Post-term pregnancy: Secondary | ICD-10-CM

## 2016-07-30 LAB — CBC
HCT: 34 % — ABNORMAL LOW (ref 36.0–46.0)
HEMOGLOBIN: 11.2 g/dL — AB (ref 12.0–15.0)
MCH: 27.3 pg (ref 26.0–34.0)
MCHC: 32.9 g/dL (ref 30.0–36.0)
MCV: 82.9 fL (ref 78.0–100.0)
Platelets: 176 10*3/uL (ref 150–400)
RBC: 4.1 MIL/uL (ref 3.87–5.11)
RDW: 13.7 % (ref 11.5–15.5)
WBC: 14.6 10*3/uL — ABNORMAL HIGH (ref 4.0–10.5)

## 2016-07-30 LAB — RPR: RPR: NONREACTIVE

## 2016-07-30 SURGERY — Surgical Case
Anesthesia: Spinal

## 2016-07-30 MED ORDER — TERBUTALINE SULFATE 1 MG/ML IJ SOLN
INTRAMUSCULAR | Status: AC
  Filled 2016-07-30: qty 1

## 2016-07-30 MED ORDER — MORPHINE SULFATE (PF) 0.5 MG/ML IJ SOLN
INTRAMUSCULAR | Status: AC
  Filled 2016-07-30: qty 10

## 2016-07-30 MED ORDER — CEFOTETAN DISODIUM-DEXTROSE 2-2.08 GM-% IV SOLR
2.0000 g | INTRAVENOUS | Status: AC
  Administered 2016-07-30: 2 g via INTRAVENOUS

## 2016-07-30 MED ORDER — WITCH HAZEL-GLYCERIN EX PADS: 1.0000 "application " | MEDICATED_PAD | CUTANEOUS | Status: DC | PRN

## 2016-07-30 MED ORDER — DIBUCAINE 1 % RE OINT: 1.0000 "application " | TOPICAL_OINTMENT | RECTAL | Status: DC | PRN

## 2016-07-30 MED ORDER — OXYCODONE HCL 5 MG PO TABS
10.0000 mg | ORAL_TABLET | ORAL | Status: DC | PRN
  Administered 2016-08-01: 10 mg via ORAL
  Filled 2016-07-30 (×2): qty 2

## 2016-07-30 MED ORDER — ACETAMINOPHEN 325 MG PO TABS
650.0000 mg | ORAL_TABLET | ORAL | Status: DC | PRN
  Administered 2016-07-30 – 2016-08-01 (×8): 650 mg via ORAL
  Filled 2016-07-30 (×9): qty 2

## 2016-07-30 MED ORDER — ONDANSETRON HCL 4 MG/2ML IJ SOLN
INTRAMUSCULAR | Status: AC
  Filled 2016-07-30: qty 2

## 2016-07-30 MED ORDER — IBUPROFEN 600 MG PO TABS
600.0000 mg | ORAL_TABLET | Freq: Four times a day (QID) | ORAL | Status: DC
  Administered 2016-07-30 – 2016-08-01 (×9): 600 mg via ORAL
  Filled 2016-07-30 (×9): qty 1

## 2016-07-30 MED ORDER — SIMETHICONE 80 MG PO CHEW
80.0000 mg | CHEWABLE_TABLET | Freq: Three times a day (TID) | ORAL | Status: DC
  Administered 2016-07-30 – 2016-08-01 (×8): 80 mg via ORAL
  Filled 2016-07-30 (×8): qty 1

## 2016-07-30 MED ORDER — SCOPOLAMINE 1 MG/3DAYS TD PT72
MEDICATED_PATCH | TRANSDERMAL | Status: DC | PRN
  Administered 2016-07-30: 1 via TRANSDERMAL

## 2016-07-30 MED ORDER — TETANUS-DIPHTH-ACELL PERTUSSIS 5-2.5-18.5 LF-MCG/0.5 IM SUSP: 0.5000 mL | Freq: Once | INTRAMUSCULAR | Status: DC

## 2016-07-30 MED ORDER — NALBUPHINE HCL 10 MG/ML IJ SOLN: 5.0000 mg | Freq: Once | INTRAMUSCULAR | Status: DC | PRN

## 2016-07-30 MED ORDER — ONDANSETRON HCL 4 MG/2ML IJ SOLN
4.0000 mg | Freq: Once | INTRAMUSCULAR | Status: AC
  Administered 2016-07-30: 4 mg via INTRAVENOUS

## 2016-07-30 MED ORDER — METOCLOPRAMIDE HCL 5 MG/ML IJ SOLN
INTRAMUSCULAR | Status: AC
  Filled 2016-07-30: qty 2

## 2016-07-30 MED ORDER — LACTATED RINGERS IV SOLN
INTRAVENOUS | Status: DC
  Administered 2016-07-30 (×2): via INTRAUTERINE

## 2016-07-30 MED ORDER — MORPHINE SULFATE (PF) 0.5 MG/ML IJ SOLN
INTRAMUSCULAR | Status: DC | PRN
  Administered 2016-07-30: .2 mg via INTRATHECAL

## 2016-07-30 MED ORDER — ONDANSETRON HCL 4 MG/2ML IJ SOLN
4.0000 mg | Freq: Three times a day (TID) | INTRAMUSCULAR | Status: DC | PRN
  Administered 2016-07-30: 4 mg via INTRAVENOUS
  Filled 2016-07-30: qty 2

## 2016-07-30 MED ORDER — NALBUPHINE HCL 10 MG/ML IJ SOLN: 5.0000 mg | INTRAMUSCULAR | Status: DC | PRN

## 2016-07-30 MED ORDER — SENNOSIDES-DOCUSATE SODIUM 8.6-50 MG PO TABS
2.0000 | ORAL_TABLET | ORAL | Status: DC
  Administered 2016-07-30 – 2016-07-31 (×2): 2 via ORAL
  Filled 2016-07-30 (×2): qty 2

## 2016-07-30 MED ORDER — PHENYLEPHRINE 8 MG IN D5W 100 ML (0.08MG/ML) PREMIX OPTIME
INJECTION | INTRAVENOUS | Status: DC | PRN
  Administered 2016-07-30: 60 ug/min via INTRAVENOUS

## 2016-07-30 MED ORDER — FENTANYL CITRATE (PF) 100 MCG/2ML IJ SOLN
INTRAMUSCULAR | Status: DC | PRN
  Administered 2016-07-30: 10 ug via INTRATHECAL

## 2016-07-30 MED ORDER — KETOROLAC TROMETHAMINE 30 MG/ML IJ SOLN
30.0000 mg | Freq: Four times a day (QID) | INTRAMUSCULAR | Status: AC | PRN
  Administered 2016-07-30: 30 mg via INTRAMUSCULAR

## 2016-07-30 MED ORDER — FENTANYL CITRATE (PF) 100 MCG/2ML IJ SOLN
INTRAMUSCULAR | Status: AC
  Filled 2016-07-30: qty 2

## 2016-07-30 MED ORDER — SCOPOLAMINE 1 MG/3DAYS TD PT72
1.0000 | MEDICATED_PATCH | Freq: Once | TRANSDERMAL | Status: DC
  Filled 2016-07-30: qty 1

## 2016-07-30 MED ORDER — COCONUT OIL OIL
1.0000 "application " | TOPICAL_OIL | Status: DC | PRN
  Administered 2016-07-31: 1 via TOPICAL
  Filled 2016-07-30: qty 120

## 2016-07-30 MED ORDER — DIPHENHYDRAMINE HCL 50 MG/ML IJ SOLN: 12.5000 mg | INTRAMUSCULAR | Status: DC | PRN

## 2016-07-30 MED ORDER — SODIUM CHLORIDE 0.9 % IR SOLN
Status: DC | PRN
  Administered 2016-07-30: 500 mL
  Administered 2016-07-30: 200 mL

## 2016-07-30 MED ORDER — KETOROLAC TROMETHAMINE 30 MG/ML IJ SOLN
INTRAMUSCULAR | Status: AC
  Filled 2016-07-30: qty 1

## 2016-07-30 MED ORDER — OXYCODONE HCL 5 MG PO TABS
5.0000 mg | ORAL_TABLET | ORAL | Status: DC | PRN
  Administered 2016-07-30 – 2016-08-01 (×8): 5 mg via ORAL
  Filled 2016-07-30 (×8): qty 1

## 2016-07-30 MED ORDER — NALOXONE HCL 0.4 MG/ML IJ SOLN: 0.4000 mg | INTRAMUSCULAR | Status: DC | PRN

## 2016-07-30 MED ORDER — BUPIVACAINE IN DEXTROSE 0.75-8.25 % IT SOLN
INTRATHECAL | Status: AC
  Filled 2016-07-30: qty 4

## 2016-07-30 MED ORDER — LACTATED RINGERS IV SOLN
INTRAVENOUS | Status: DC
  Administered 2016-07-30: 13:00:00 via INTRAVENOUS

## 2016-07-30 MED ORDER — PROMETHAZINE HCL 25 MG/ML IJ SOLN
6.2500 mg | Freq: Four times a day (QID) | INTRAMUSCULAR | Status: DC | PRN
  Administered 2016-07-30: 6.25 mg via INTRAVENOUS

## 2016-07-30 MED ORDER — KETOROLAC TROMETHAMINE 30 MG/ML IJ SOLN: 30.0000 mg | Freq: Four times a day (QID) | INTRAMUSCULAR | Status: AC | PRN

## 2016-07-30 MED ORDER — METOCLOPRAMIDE HCL 5 MG/ML IJ SOLN
10.0000 mg | Freq: Once | INTRAMUSCULAR | Status: AC | PRN
  Administered 2016-07-30: 10 mg via INTRAVENOUS

## 2016-07-30 MED ORDER — TERBUTALINE SULFATE 1 MG/ML IJ SOLN
0.2500 mg | Freq: Once | INTRAMUSCULAR | Status: AC
  Administered 2016-07-30: 0.25 mg via SUBCUTANEOUS

## 2016-07-30 MED ORDER — LACTATED RINGERS IV SOLN: INTRAVENOUS | Status: DC

## 2016-07-30 MED ORDER — SODIUM CHLORIDE 0.9% FLUSH: 3.0000 mL | INTRAVENOUS | Status: DC | PRN

## 2016-07-30 MED ORDER — ONDANSETRON HCL 4 MG/2ML IJ SOLN
INTRAMUSCULAR | Status: DC | PRN
Start: 2016-07-30 — End: 2016-07-30
  Administered 2016-07-30: 4 mg via INTRAVENOUS

## 2016-07-30 MED ORDER — PRENATAL MULTIVITAMIN CH
1.0000 | ORAL_TABLET | Freq: Every day | ORAL | Status: DC
  Administered 2016-07-30 – 2016-07-31 (×2): 1 via ORAL
  Filled 2016-07-30 (×3): qty 1

## 2016-07-30 MED ORDER — OXYTOCIN 10 UNIT/ML IJ SOLN
INTRAVENOUS | Status: DC | PRN
  Administered 2016-07-30: 40 [IU] via INTRAVENOUS

## 2016-07-30 MED ORDER — DIPHENHYDRAMINE HCL 25 MG PO CAPS: 25.0000 mg | ORAL_CAPSULE | ORAL | Status: DC | PRN

## 2016-07-30 MED ORDER — FENTANYL CITRATE (PF) 100 MCG/2ML IJ SOLN: 25.0000 ug | INTRAMUSCULAR | Status: DC | PRN

## 2016-07-30 MED ORDER — SIMETHICONE 80 MG PO CHEW: 80.0000 mg | CHEWABLE_TABLET | ORAL | Status: DC | PRN

## 2016-07-30 MED ORDER — MENTHOL 3 MG MT LOZG: 1.0000 | LOZENGE | OROMUCOSAL | Status: DC | PRN

## 2016-07-30 MED ORDER — PROMETHAZINE HCL 25 MG/ML IJ SOLN
INTRAMUSCULAR | Status: AC
  Filled 2016-07-30: qty 1

## 2016-07-30 MED ORDER — SCOPOLAMINE 1 MG/3DAYS TD PT72
MEDICATED_PATCH | TRANSDERMAL | Status: AC
  Filled 2016-07-30: qty 1

## 2016-07-30 MED ORDER — PHENYLEPHRINE 8 MG IN D5W 100 ML (0.08MG/ML) PREMIX OPTIME
INJECTION | INTRAVENOUS | Status: AC
  Filled 2016-07-30: qty 100

## 2016-07-30 MED ORDER — BUPIVACAINE IN DEXTROSE 0.75-8.25 % IT SOLN
INTRATHECAL | Status: DC | PRN
  Administered 2016-07-30: 1.4 mL via INTRATHECAL

## 2016-07-30 MED ORDER — DIPHENHYDRAMINE HCL 25 MG PO CAPS: 25.0000 mg | ORAL_CAPSULE | Freq: Four times a day (QID) | ORAL | Status: DC | PRN

## 2016-07-30 MED ORDER — LACTATED RINGERS IV SOLN
INTRAVENOUS | Status: DC | PRN
  Administered 2016-07-30: 02:00:00 via INTRAVENOUS

## 2016-07-30 MED ORDER — MEPERIDINE HCL 25 MG/ML IJ SOLN: 6.2500 mg | INTRAMUSCULAR | Status: DC | PRN

## 2016-07-30 MED ORDER — SIMETHICONE 80 MG PO CHEW
80.0000 mg | CHEWABLE_TABLET | ORAL | Status: DC
  Administered 2016-07-30 – 2016-07-31 (×2): 80 mg via ORAL
  Filled 2016-07-30 (×2): qty 1

## 2016-07-30 MED ORDER — OXYTOCIN 10 UNIT/ML IJ SOLN
INTRAMUSCULAR | Status: AC
  Filled 2016-07-30: qty 4

## 2016-07-30 MED ORDER — NALOXONE HCL 2 MG/2ML IJ SOSY
1.0000 ug/kg/h | PREFILLED_SYRINGE | INTRAVENOUS | Status: DC | PRN
  Filled 2016-07-30: qty 2

## 2016-07-30 MED ORDER — ONDANSETRON HCL 4 MG/2ML IJ SOLN
INTRAMUSCULAR | Status: AC
Start: 2016-07-30 — End: 2016-07-30
  Filled 2016-07-30: qty 2

## 2016-07-30 MED ORDER — OXYTOCIN 40 UNITS IN LACTATED RINGERS INFUSION - SIMPLE MED: 2.5000 [IU]/h | INTRAVENOUS | Status: AC

## 2016-07-30 SURGICAL SUPPLY — 32 items
BENZOIN TINCTURE PRP APPL 2/3 (GAUZE/BANDAGES/DRESSINGS) ×3 IMPLANT
CHLORAPREP W/TINT 26ML (MISCELLANEOUS) ×3 IMPLANT
CLAMP CORD UMBIL (MISCELLANEOUS) IMPLANT
CLOSURE STERI-STRIP 1/2X4 (GAUZE/BANDAGES/DRESSINGS) ×1
CLSR STERI-STRIP ANTIMIC 1/2X4 (GAUZE/BANDAGES/DRESSINGS) ×2 IMPLANT
CONTAINER PREFILL 10% NBF 15ML (MISCELLANEOUS) IMPLANT
DRSG OPSITE POSTOP 4X10 (GAUZE/BANDAGES/DRESSINGS) ×3 IMPLANT
ELECT REM PT RETURN 9FT ADLT (ELECTROSURGICAL) ×3
ELECTRODE REM PT RTRN 9FT ADLT (ELECTROSURGICAL) ×1 IMPLANT
EXTRACTOR VACUUM M CUP 4 TUBE (SUCTIONS) IMPLANT
EXTRACTOR VACUUM M CUP 4' TUBE (SUCTIONS)
GAUZE SPONGE 4X4 12PLY STRL LF (GAUZE/BANDAGES/DRESSINGS) ×3 IMPLANT
GLOVE BIOGEL PI IND STRL 6.5 (GLOVE) ×1 IMPLANT
GLOVE BIOGEL PI IND STRL 7.0 (GLOVE) ×1 IMPLANT
GLOVE BIOGEL PI INDICATOR 6.5 (GLOVE) ×2
GLOVE BIOGEL PI INDICATOR 7.0 (GLOVE) ×2
GLOVE SURG SS PI 6.0 STRL IVOR (GLOVE) ×3 IMPLANT
GOWN STRL REUS W/TWL LRG LVL3 (GOWN DISPOSABLE) ×6 IMPLANT
KIT ABG SYR 3ML LUER SLIP (SYRINGE) IMPLANT
NEEDLE HYPO 25X5/8 SAFETYGLIDE (NEEDLE) IMPLANT
NS IRRIG 1000ML POUR BTL (IV SOLUTION) ×3 IMPLANT
PACK C SECTION WH (CUSTOM PROCEDURE TRAY) ×3 IMPLANT
PAD ABD 7.5X8 STRL (GAUZE/BANDAGES/DRESSINGS) ×3 IMPLANT
PAD OB MATERNITY 4.3X12.25 (PERSONAL CARE ITEMS) ×3 IMPLANT
PENCIL SMOKE EVAC W/HOLSTER (ELECTROSURGICAL) ×3 IMPLANT
RTRCTR C-SECT PINK 25CM LRG (MISCELLANEOUS) IMPLANT
SEPRAFILM MEMBRANE 5X6 (MISCELLANEOUS) IMPLANT
SUT PLAIN 0 NONE (SUTURE) IMPLANT
SUT VIC AB 0 CT1 36 (SUTURE) ×15 IMPLANT
SUT VIC AB 4-0 KS 27 (SUTURE) ×3 IMPLANT
TOWEL OR 17X24 6PK STRL BLUE (TOWEL DISPOSABLE) ×3 IMPLANT
TRAY FOLEY BAG SILVER LF 14FR (SET/KITS/TRAYS/PACK) ×3 IMPLANT

## 2016-07-30 NOTE — Anesthesia Procedure Notes (Signed)
Spinal  Patient location during procedure: OR Staffing Anesthesiologist: Montez Hageman Performed: anesthesiologist  Preanesthetic Checklist Completed: patient identified, site marked, surgical consent, pre-op evaluation, timeout performed, IV checked, risks and benefits discussed and monitors and equipment checked Spinal Block Patient position: sitting Prep: DuraPrep Patient monitoring: heart rate, continuous pulse ox and blood pressure Approach: midline Location: L3-4 Injection technique: single-shot Needle Needle type: Sprotte  Needle gauge: 24 G Needle length: 9 cm Additional Notes Multiple attempts with std 24g needle. Unable to reach csf with full depth insertion. Switched to [employer]. LOR at 10-11cm with tenting of skin. CSE needle passed without trouble.

## 2016-07-30 NOTE — Anesthesia Postprocedure Evaluation (Signed)
Anesthesia Post Note  Patient: Holly Glass  Procedure(s) Performed: Procedure(s) (LRB): CESAREAN SECTION (N/A)  Patient location during evaluation: Mother Baby Anesthesia Type: Spinal Level of consciousness: awake Pain management: pain level controlled Vital Signs Assessment: post-procedure vital signs reviewed and stable Respiratory status: spontaneous breathing Cardiovascular status: stable Postop Assessment: no headache, no backache, spinal receding and patient able to bend at knees Anesthetic complications: no        Last Vitals:  Vitals:   07/30/16 0643 07/30/16 0745  BP: 108/68 125/69  Pulse: 69 69  Resp: 16 18  Temp: 36.4 C 36.5 C    Last Pain:  Vitals:   07/30/16 0745  TempSrc: Oral  PainSc: 4    Pain Goal:                 Edison Pace

## 2016-07-30 NOTE — Transfer of Care (Signed)
Immediate Anesthesia Transfer of Care Note  Patient: Holly Glass  Procedure(s) Performed: Procedure(s): CESAREAN SECTION (N/A)  Patient Location: PACU  Anesthesia Type:Spinal  Level of Consciousness: awake, alert , oriented and patient cooperative  Airway & Oxygen Therapy: Patient Spontanous Breathing  Post-op Assessment: Post -op Vital signs reviewed and stable  Post vital signs: Reviewed and stable  Last Vitals:  Vitals:   07/30/16 0044 07/30/16 0045  BP:    Pulse: 98 98  Resp:    Temp:      Last Pain:  Vitals:   07/30/16 0030  TempSrc: Axillary  PainSc:          Complications: No apparent anesthesia complications

## 2016-07-30 NOTE — Progress Notes (Signed)
Patient ID: Holly Glass, female   DOB: 1991-03-18, 26 y.o.   MRN: [id number] 26 yo G2P1 at 41w here for IOL due to postdates with h/o previous cesarean section. Patient with recurrent deep variable decels on 2 milliunits of pitocin. Despite adequate intrauterine resuscitation, fetal status remains non reassuring and no cervical change has been made. Discussed delivery via repeat cesarean section. Risks, benefits and alternatives were explained including but not limited to risks of bleeding, infection and damage to adjacent organs. Patient verbalized understanding and agrees with plan. All questions were answered.

## 2016-07-30 NOTE — Op Note (Signed)
Holly Glass PROCEDURE DATE: 07/29/2016 - 07/30/2016  PREOPERATIVE DIAGNOSIS: Intrauterine pregnancy at  [redacted]w[redacted]d weeks gestation; non-reassuring fetal status  POSTOPERATIVE DIAGNOSIS: The same  PROCEDURE:     Cesarean Section  SURGEON:  Dr. Catalina Antigua  ASSISTANT: none  INDICATIONS: Holly Glass is a 26 y.o. [id number] at [redacted]w[redacted]d scheduled for cesarean section secondary to non-reassuring fetal status.  The risks of cesarean section discussed with the patient included but were not limited to: bleeding which may require transfusion or reoperation; infection which may require antibiotics; injury to bowel, bladder, ureters or other surrounding organs; injury to the fetus; need for additional procedures including hysterectomy in the event of a life-threatening hemorrhage; placental abnormalities wth subsequent pregnancies, incisional problems, thromboembolic phenomenon and other postoperative/anesthesia complications. The patient concurred with the proposed plan, giving informed written consent for the procedure.    FINDINGS:  Viable female infant in cephalic presentation.  Apgars 9 and 9.  Thin meconium stained amniotic fluid.  Intact placenta, three vessel cord.  Normal uterus, fallopian tubes and ovaries bilaterally.  ANESTHESIA:    Spinal INTRAVENOUS FLUIDS:2000 ml ESTIMATED BLOOD LOSS: 800 ml URINE OUTPUT:  300 ml SPECIMENS: Placenta sent to L&D COMPLICATIONS: None immediate  PROCEDURE IN DETAIL:  The patient received intravenous antibiotics and had sequential compression devices applied to her lower extremities while in the preoperative area.  She was then taken to the operating room where anesthesia was induced and was found to be adequate. A foley catheter was placed into her bladder and attached to Future [place] gravity. She was then placed in a dorsal supine position with a leftward tilt, and prepped and draped in a sterile manner. After an adequate timeout was performed, a Pfannenstiel skin incision  was made with scalpel and carried through to the underlying layer of fascia. The fascia was incised in the midline and this incision was extended bilaterally using the Mayo scissors. Kocher clamps were applied to the superior aspect of the fascial incision and the underlying rectus muscles were dissected off bluntly. A similar process was carried out on the inferior aspect of the facial incision. The rectus muscles were separated in the midline bluntly and the peritoneum was entered bluntly. The Alexis self-retaining retractor was introduced into the abdominal cavity. Attention was turned to the lower uterine segment where a bladder flap was created, and a transverse hysterotomy was made with a scalpel and extended bilaterally bluntly. The infant was successfully delivered, and cord was clamped and cut and infant was handed over to awaiting neonatology team. Uterine massage was then administered and the placenta delivered intact with three-vessel cord. The uterus was cleared of clot and debris.  The hysterotomy was closed with 0 Vicryl in a running locked fashion, and an imbricating layer was also placed with a 0 Vicryl. Overall, excellent hemostasis was noted. The pelvis copiously irrigated and cleared of all clot and debris. Hemostasis was confirmed on all surfaces.  The peritoneum and the muscles were reapproximated using 0 vicryl interrupted stitches. The fascia was then closed using 0 Vicryl in a running fashion.  The skin was closed in a subcuticular fashion using 3.0 Vicryl. The patient tolerated the procedure well. Sponge, lap, instrument and needle counts were correct x 2. She was taken to the recovery room in stable condition.    Lisbeth Puller ConstantMD  07/30/2016 2:44 AM

## 2016-07-30 NOTE — Progress Notes (Signed)
Patient ID: [name], female   DOB: 13-Jul-1990, 26 y.o.   MRN: [id number]  Holly Glass has been stopped due to FHR decels; pt comfortable VSS, afeb FHR 160s, +accels, occ mi variables Ctx irreg (IUPC in place) Cx very post/5+/60/-3 Initially membrane possibly palpated, but no success in fluid return w/ amnihook; presumed ruptured from prior AROM attempt  IUP@term  TOLAC IOL process  Will start Pitocin again, 1x1 until reaching 23mu/min, then increase by 2 Rev'd w/ pt and family- will watch FHR closely; they are on board with this plan  Cam Hai CNM 07/30/2016 12:24 AM

## 2016-07-30 NOTE — Anesthesia Preprocedure Evaluation (Signed)
Anesthesia Evaluation  Patient identified by MRN, date of birth, ID band Patient awake    Reviewed: Allergy & Precautions, NPO status , Patient's Chart, lab work & pertinent test results  Airway Mallampati: II  TM Distance: >3 FB Neck ROM: Full    Dental no notable dental hx.    Pulmonary neg pulmonary ROS,    Pulmonary exam normal breath sounds clear to auscultation       Cardiovascular negative cardio ROS Normal cardiovascular exam Rhythm:Regular Rate:Normal     Neuro/Psych negative neurological ROS  negative psych ROS   GI/Hepatic negative GI ROS, Neg liver ROS,   Endo/Other  Morbid obesity  Renal/GU negative Renal ROS  negative genitourinary   Musculoskeletal negative musculoskeletal ROS (+)   Abdominal   Peds negative pediatric ROS (+)  Hematology negative hematology ROS (+)   Anesthesia Other Findings   Reproductive/Obstetrics negative OB ROS (+) Pregnancy                             Anesthesia Physical Anesthesia Plan  ASA: III and emergent  Anesthesia Plan: Spinal   Post-op Pain Management:    Induction:   Airway Management Planned: Natural Airway  Additional Equipment:   Intra-op Plan:   Post-operative Plan:   Informed Consent: I have reviewed the patients History and Physical, chart, labs and discussed the procedure including the risks, benefits and alternatives for the proposed anesthesia with the patient or authorized representative who has indicated his/her understanding and acceptance.   Dental advisory given  Plan Discussed with: CRNA  Anesthesia Plan Comments:         Anesthesia Quick Evaluation

## 2016-07-31 LAB — BIRTH TISSUE RECOVERY COLLECTION (PLACENTA DONATION)

## 2016-07-31 NOTE — Progress Notes (Signed)
Subjective: Postpartum Day #1: Cesarean Delivery Patient reports incisional pain, tolerating PO and no problems voiding.    Objective: Vital signs in last 24 hours: Temp:  [97.7 F (36.5 C)-98.8 F (37.1 C)] 98.8 F (37.1 C) (05/03 0550) Pulse Rate:  [69-80] 78 (05/03 0550) Resp:  [16-18] 16 (05/03 0550) BP: (111-125)/(66-84) 111/84 (05/03 0550) SpO2:  [97 %-99 %] 98 % (05/02 2150)  Physical Exam:  General: alert, cooperative and no distress Lochia: appropriate Uterine Fundus: firm Incision: no significant drainage, no dehiscence, no significant erythema, pressure dressing removed, honey comb dressing ordered DVT Evaluation: No evidence of DVT seen on physical exam. No cords or calf tenderness. No significant calf/ankle edema.   Recent Labs  07/29/16 0822 07/30/16 1304  HGB 12.3 11.2*  HCT 37.7 34.0*    Assessment/Plan: Status post Cesarean section. Doing well postoperatively.  Continue current care.  Plan for discharge home 08/01/16.    Roe Coombsachelle A Emaan Gary, CNM 07/31/2016, 7:21 AM

## 2016-08-01 MED ORDER — IBUPROFEN 600 MG PO TABS: 600.0000 mg | ORAL_TABLET | Freq: Four times a day (QID) | ORAL | 0 refills | Status: DC

## 2016-08-01 MED ORDER — ACETAMINOPHEN 325 MG PO TABS: 650.0000 mg | ORAL_TABLET | ORAL | 0 refills | Status: DC | PRN

## 2016-08-01 MED ORDER — OXYCODONE HCL 5 MG PO TABS: 5.0000 mg | ORAL_TABLET | ORAL | 0 refills | Status: DC | PRN

## 2016-08-01 NOTE — Lactation Note (Signed)
This note was copied from a baby's chart. Lactation Consultation Note  Patient Name: Holly Frederich ChaChelsea Drabik HQION'GToday's Date: 08/01/2016 Reason for consult: Follow-up assessment Baby 59 hrs old.  Mom with headache, so might not be discharged.  FOB trying to swaddle baby as baby noted to be cueing.  Offered to assist with a latch as Mom stated she had some soreness.  Baby positioned in football hold.  Mom trying to latch baby without baby opening widely, and then Mom forcing nipple into baby's mouth.  Demonstrated how to elicit a wide open gape of mouth prior to bringing baby onto breast.  Mom stated latch felt much better.  Basics reviewed.  Encouraged alternate breast compression during sucking.  Encouraged STS and cue based feedings.  Set Mom's head back to ease her headache.  Mom very appreciative of assistance.   To ask for help as needed.  Lactation to follow up in am.   Consult Status Consult Status: Follow-up Date: 08/02/16 Follow-up type: In-patient    Holly Glass, Holly Glass 08/01/2016, 1:31 PM

## 2016-08-01 NOTE — Consult Note (Signed)
POD#2 s/p C/S under SAB. Difficult placement d/t body habitus requiring multiple attempts and a CSE technique.  Called to see patient for headache evaluation. Positive story for post dural puncture headache with strong postural component. Partial relief with PO opioids. Has not tried caffeine. No photophobia or weakness, back pain or incontinenece.  A/P:  Probable Post dural puncture headache:  After long discussion with patient, a reasonable plan was formulated to include planned discharge today, copious amount of PO liquid caffeinated beverages and analgesics. She was instructed if the heacdache worsens or becomes intolerable, she can call Anesthesia or come to [facility] to arrange for an epidural blood patch at anytime.  OK for d/c to home if primary service agrees.  [employer] Corporation[employer]ignan

## 2016-08-01 NOTE — Discharge Summary (Signed)
OB Discharge Summary     Patient Name: Holly Glass DOB: Jan 12, 1991 MRN: [id number]  Date of admission: 07/29/2016 Delivering MD: Catalina Antigua   Date of discharge: 08/01/2016  Admitting diagnosis: INDUCTION Intrauterine pregnancy: [redacted]w[redacted]d     Secondary diagnosis:  Active Problems:   Post term pregnancy at [redacted] weeks gestation  Additional problems: none     Discharge diagnosis: Term Pregnancy Delivered                                                                                                Post partum procedures:none  Augmentation: AROM, Pitocin and Foley Balloon  Complications: None  Hospital course:  Induction of Labor With Cesarean Section  26 y.o. yo G2P2002 at [redacted]w[redacted]d was admitted to the hospital 07/29/2016 for induction of labor. Patient had a labor course significant for Non-reasuring fetal heart tones. The patient went for cesarean section due to Non-Reassuring FHR, and delivered a Viable infant  @6 :53 PM ,07/29/2016   . Details of operation can be found in separate operative Note.    Patient had an uncomplicated postpartum course. She is ambulating, tolerating a regular diet, passing flatus, and urinating well.  Patient is discharged home in stable condition on 08/01/16.                                    Physical exam  Vitals:   07/30/16 2150 07/31/16 0550 08/01/16 0550 08/01/16 1220  BP: 112/67 111/84 128/79 110/72  Pulse: 80 78 85 67  Resp: 18 16 18    Temp: 98.1 F (36.7 C) 98.8 F (37.1 C) 97.4 F (36.3 C)   TempSrc: Oral Oral Oral   SpO2: 98%     Weight:      Height:       General: alert, cooperative and no distress Lochia: appropriate Uterine Fundus: firm Incision: Dressing is clean, dry, and intact DVT Evaluation: No evidence of DVT seen on physical exam. Labs: Lab Results  Component Value Date   WBC 14.6 (H) 07/30/2016   HGB 11.2 (L) 07/30/2016   HCT 34.0 (L) 07/30/2016   MCV 82.9 07/30/2016   PLT 176 07/30/2016   No flowsheet data  found.  Discharge instruction: per After Visit Summary and "Baby and Me Booklet".  After visit meds:  Allergies as of 08/01/2016      Reactions   Zyrtec Allergy [cetirizine Hcl] Hives   Only Liquid zyrtec gives allergy      Medication List    TAKE these medications   acetaminophen 325 MG tablet Commonly known as:  TYLENOL Take 2 tablets (650 mg total) by mouth every 4 (four) hours as needed (for pain scale < 4).   ibuprofen 600 MG tablet Commonly known as:  ADVIL,MOTRIN Take 1 tablet (600 mg total) by mouth every 6 (six) hours.   oxyCODONE 5 MG immediate release tablet Commonly known as:  Oxy IR/ROXICODONE Take 1 tablet (5 mg total) by mouth every 4 (four) hours as needed (pain scale 4-7).   prenatal vitamin  w/FE, FA 27-1 MG Tabs tablet Take 1 tablet by mouth daily at 12 noon.       Diet: routine diet  Activity: Advance as tolerated. Pelvic rest for 6 weeks.   Outpatient follow up:6 wks Follow up Appt:Future Appointments Date Time Provider Department Center  09/02/2016 10:00 AM Catalina AntiguaPeggy Constant, MD [facility] None   Follow up Visit:No Follow-up on file.  Postpartum contraception: Undecided  Newborn Data: Live born female  Birth Weight: 7 lb 10.2 oz (3465 g) APGAR: 9, 9  Baby Feeding: Bottle and Breast Disposition:home with mother   08/01/2016 [name], MD   Attestation of Attending Supervision of Fellow: Evaluation and management procedures were performed by the fellow under my supervision.  I have reviewed the fellow's note and chart, and I agree with the management and plan.   Cornelia Copaharlie Zamyia Gowell, Jr MD Attending Center for [facility](Faculty Practice)

## 2016-08-01 NOTE — Addendum Note (Signed)
Addendum  created 08/01/16 1525 by Phillips GroutPeter Promyse Ardito, MD   Sign clinical note

## 2016-09-02 ENCOUNTER — Ambulatory Visit: Payer: Self-pay | Admitting: Obstetrics and Gynecology

## 2016-10-09 NOTE — Addendum Note (Signed)
Addendum  created 10/09/16 1428 by Camilia Caywood, MD   Sign clinical note    

## 2016-10-09 NOTE — Anesthesia Postprocedure Evaluation (Signed)
Anesthesia Post Note  Patient: Holly Glass  Procedure(s) Performed: Procedure(s) (LRB): CESAREAN SECTION (N/A)     Anesthesia Post Evaluation  Last Vitals:  Vitals:   08/01/16 0550 08/01/16 1220  BP: 128/79 110/72  Pulse: 85 67  Resp: 18   Temp: 36.3 C     Last Pain:  Vitals:   08/01/16 1500  TempSrc:   PainSc: 4                  Phillips Groutarignan, Danasia Baker

## 2017-03-03 ENCOUNTER — Encounter: Payer: Medicaid Other | Admitting: Obstetrics & Gynecology

## 2017-03-10 LAB — OB RESULTS CONSOLE HIV ANTIBODY (ROUTINE TESTING): HIV: NONREACTIVE

## 2017-03-10 LAB — OB RESULTS CONSOLE GC/CHLAMYDIA
CHLAMYDIA, DNA PROBE: NEGATIVE
Gonorrhea: NEGATIVE

## 2017-03-10 LAB — OB RESULTS CONSOLE ABO/RH: RH TYPE: POSITIVE

## 2017-03-10 LAB — OB RESULTS CONSOLE ANTIBODY SCREEN: ANTIBODY SCREEN: NEGATIVE

## 2017-03-10 LAB — OB RESULTS CONSOLE RUBELLA ANTIBODY, IGM: RUBELLA: NON-IMMUNE/NOT IMMUNE

## 2017-03-10 LAB — OB RESULTS CONSOLE HEPATITIS B SURFACE ANTIGEN: HEP B S AG: NEGATIVE

## 2017-03-10 LAB — OB RESULTS CONSOLE RPR: RPR: NONREACTIVE

## 2017-03-19 IMAGING — US US OB COMP LESS 14 WK
1 series · 15 of 28 positions shown · non-contrast
Comparison: None.

CLINICAL DATA: Dating, viability

EXAM:
OBSTETRIC <14 WK ULTRASOUND
TECHNIQUE: Transabdominal ultrasound was performed for evaluation of the
gestation as well as the maternal uterus and adnexal regions.

[Series 1: us ob comp less 14 wk · 15 of 38 slices shown]
[im 1/38]
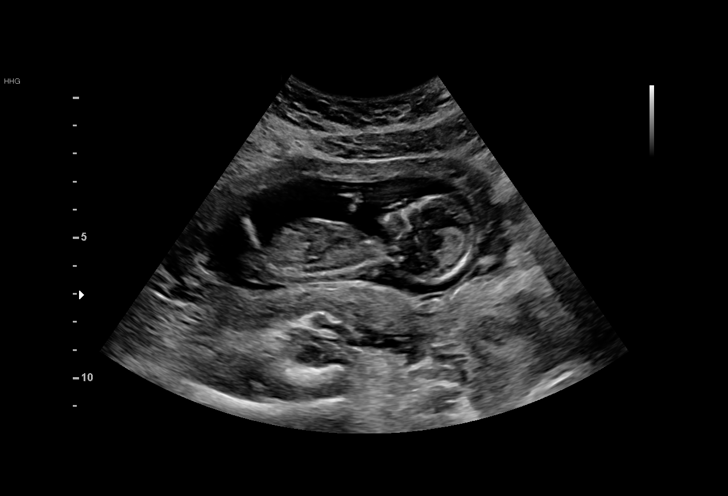
[im 3/38]
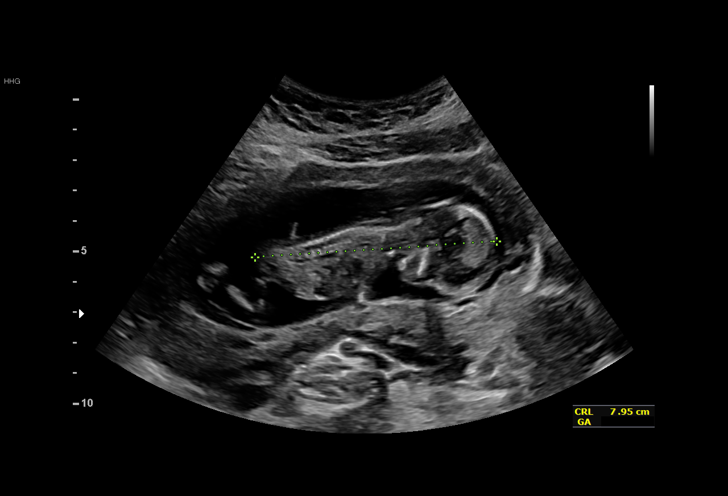
[im 6/38]
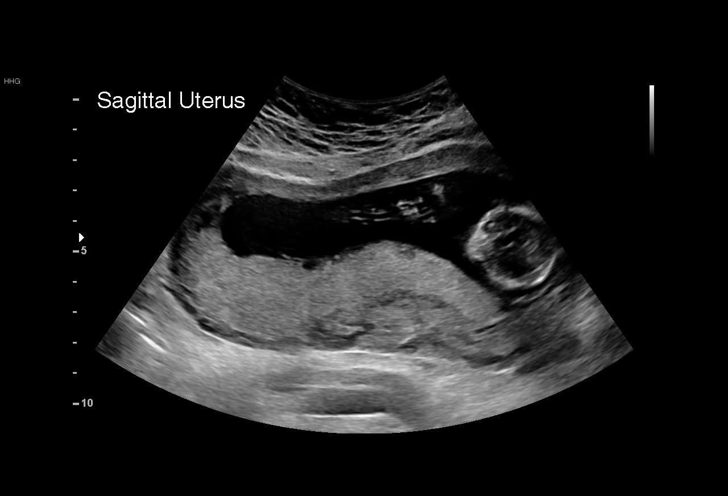
[im 9/38]
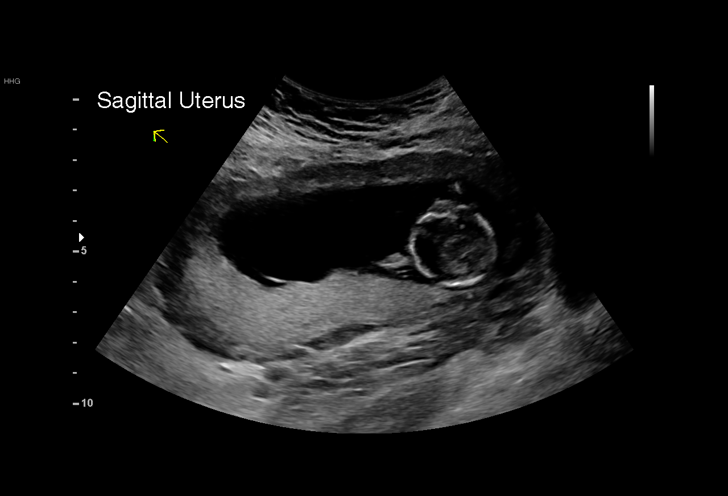
[im 11/38]
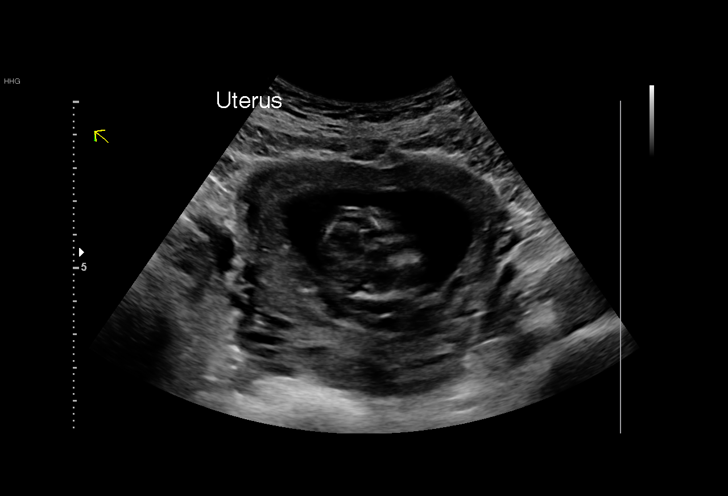
[im 14/38]
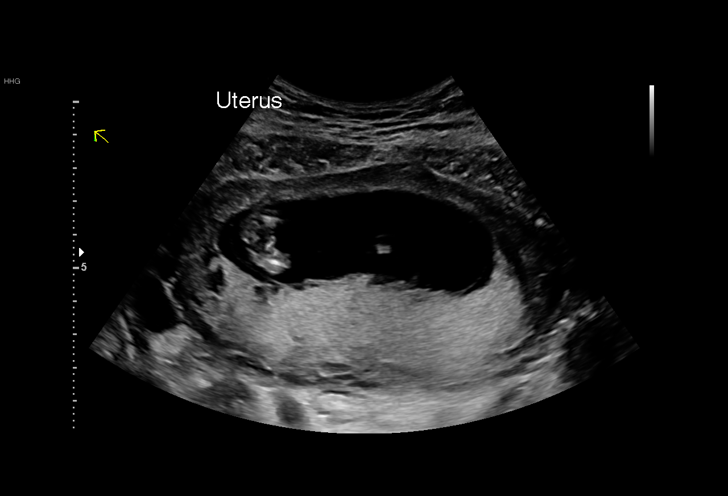
[im 17/38]
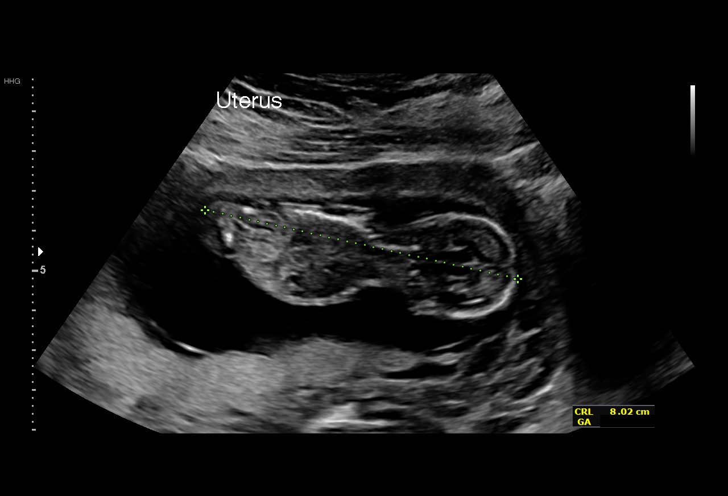
[im 20/38]
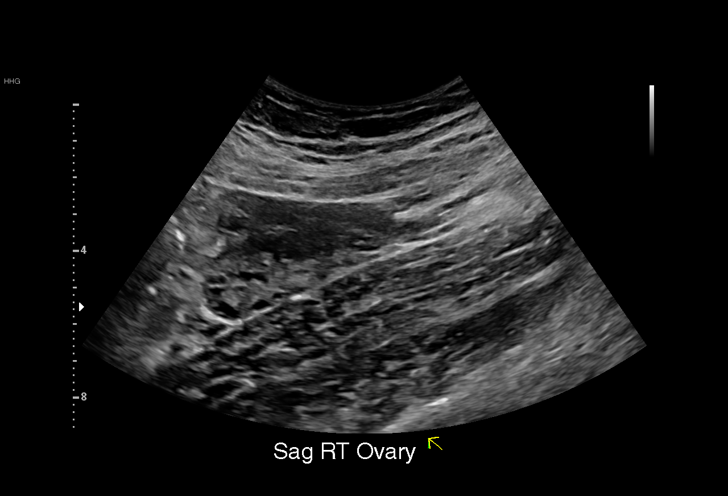
[im 21/38]
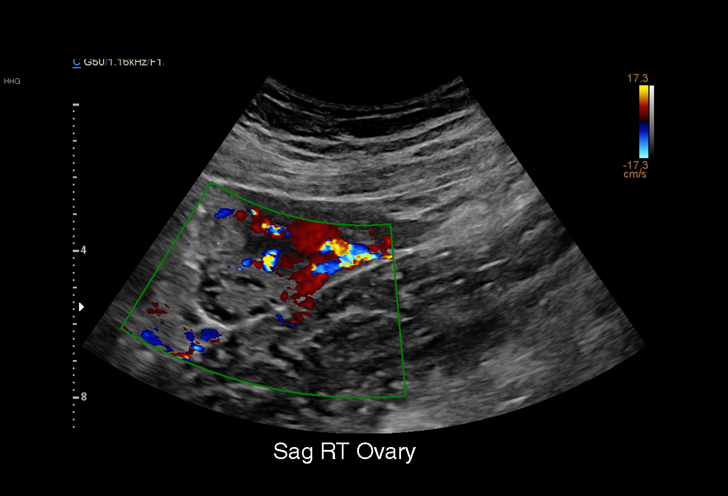
[im 24/38]
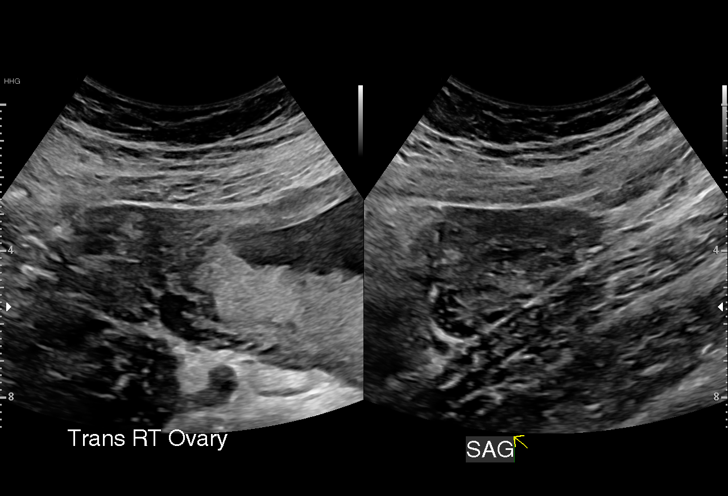
[im 27/38]
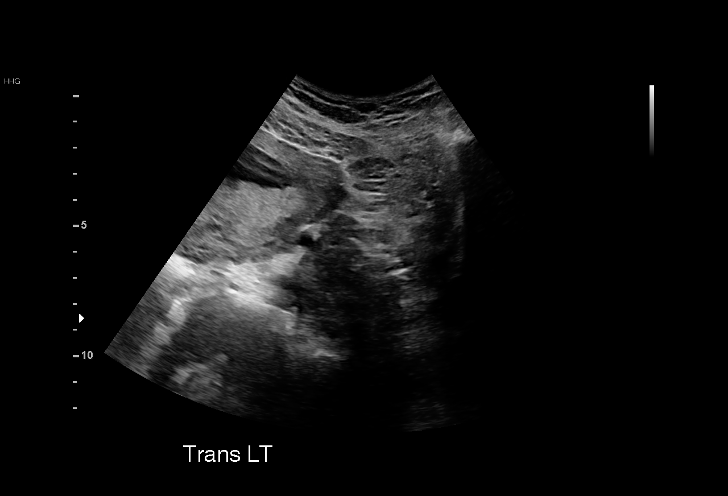
[im 29/38]
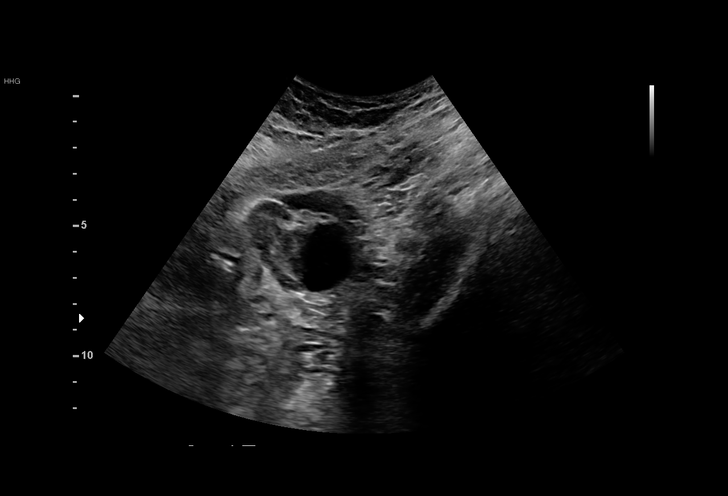
[im 32/38]
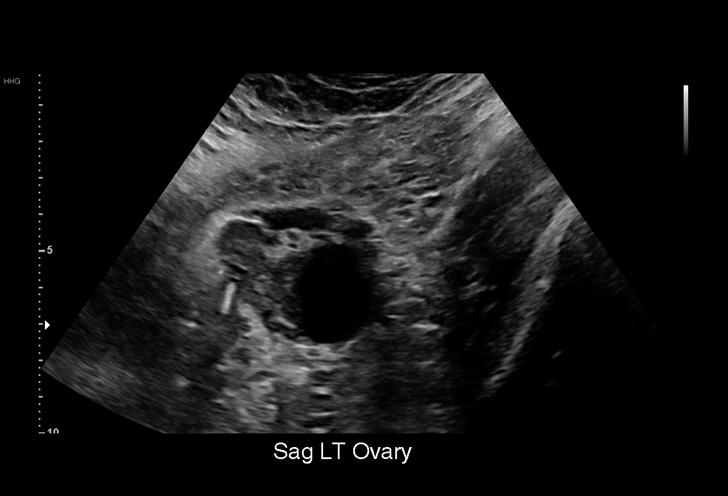
[im 35/38]
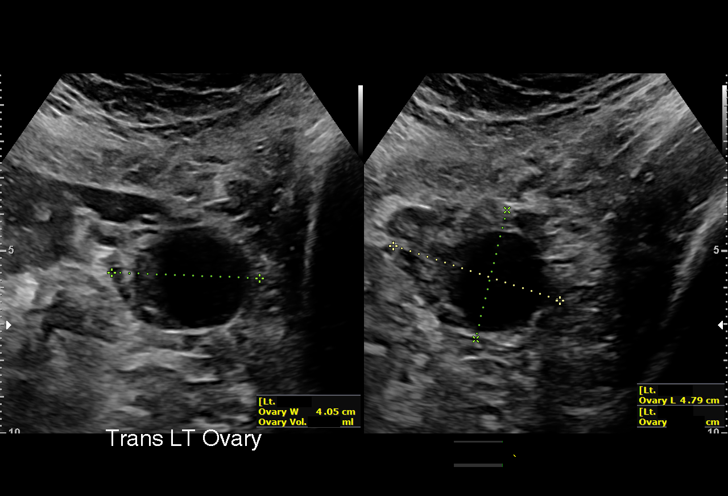
[im 38/38]
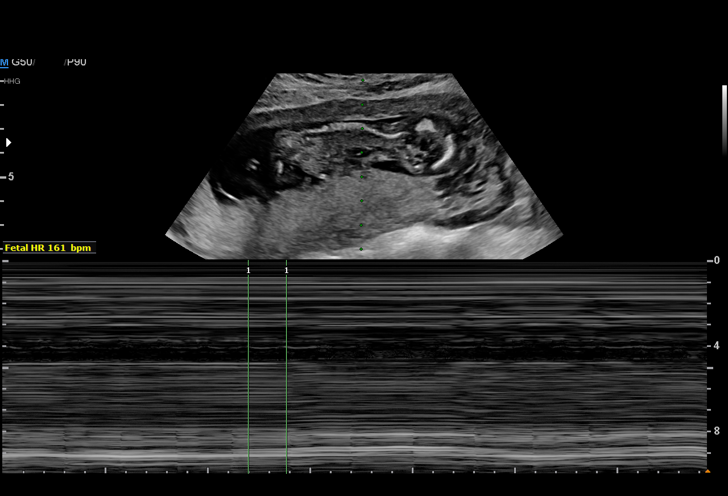

[15 of 28 positions shown; findings below may reference images not displayed]

FINDINGS: Intrauterine gestational sac: Single

Yolk sac:  Not visualized

Embryo:  Visualized

Cardiac Activity: Visualized

Heart Rate: 161 bpm

MSD:   mm    w     d

CRL:   80  mm   13 w 6 d                  US EDC: 07/22/2016

Subchorionic hemorrhage:  None visualized.

Maternal uterus/adnexae: 2.9 cm simple appearing cyst in the left
ovary. No adnexal masses or free fluid.
IMPRESSION: Thirteen week 6 day intrauterine pregnancy. Fetal heart rate 161
beats per minute. No acute maternal findings.

## 2017-06-01 ENCOUNTER — Other Ambulatory Visit: Payer: Self-pay | Admitting: Obstetrics & Gynecology

## 2017-06-08 ENCOUNTER — Other Ambulatory Visit: Payer: Self-pay | Admitting: Obstetrics and Gynecology

## 2017-07-14 ENCOUNTER — Encounter (HOSPITAL_COMMUNITY): Payer: Self-pay | Admitting: *Deleted

## 2017-07-14 ENCOUNTER — Inpatient Hospital Stay (HOSPITAL_COMMUNITY)
Admission: AD | Admit: 2017-07-14 | Discharge: 2017-07-14 | Disposition: A | Discharge status: Home / Self Care | Payer: Medicaid Other | Source: Ambulatory Visit | Attending: Obstetrics and Gynecology | Admitting: Obstetrics and Gynecology

## 2017-07-14 ENCOUNTER — Other Ambulatory Visit: Payer: Self-pay

## 2017-07-14 DIAGNOSIS — R51 Headache: Secondary | ICD-10-CM | POA: Insufficient documentation

## 2017-07-14 DIAGNOSIS — Z3A36 36 weeks gestation of pregnancy: Secondary | ICD-10-CM | POA: Diagnosis not present

## 2017-07-14 DIAGNOSIS — O26893 Other specified pregnancy related conditions, third trimester: Secondary | ICD-10-CM | POA: Diagnosis not present

## 2017-07-14 DIAGNOSIS — O10013 Pre-existing essential hypertension complicating pregnancy, third trimester: Secondary | ICD-10-CM

## 2017-07-14 LAB — URINALYSIS, ROUTINE W REFLEX MICROSCOPIC
Bilirubin Urine: NEGATIVE
GLUCOSE, UA: NEGATIVE mg/dL
Hgb urine dipstick: NEGATIVE
KETONES UR: 5 mg/dL — AB
LEUKOCYTES UA: NEGATIVE
NITRITE: NEGATIVE
PROTEIN: NEGATIVE mg/dL
Specific Gravity, Urine: 1.025 (ref 1.005–1.030)
pH: 6 (ref 5.0–8.0)

## 2017-07-14 LAB — COMPREHENSIVE METABOLIC PANEL
ALBUMIN: 2.9 g/dL — AB (ref 3.5–5.0)
ALK PHOS: 125 U/L (ref 38–126)
ALT: 15 U/L (ref 14–54)
ANION GAP: 10 (ref 5–15)
AST: 18 U/L (ref 15–41)
BUN: 7 mg/dL (ref 6–20)
CALCIUM: 8.3 mg/dL — AB (ref 8.9–10.3)
CHLORIDE: 109 mmol/L (ref 101–111)
CO2: 19 mmol/L — AB (ref 22–32)
Creatinine, Ser: 0.51 mg/dL (ref 0.44–1.00)
GFR calc Af Amer: >60 mL/min (ref >60)
GFR calc non Af Amer: >60 mL/min (ref >60)
Glucose, Bld: 121 mg/dL — ABNORMAL HIGH (ref 65–99)
Potassium: 3.5 mmol/L (ref 3.5–5.1)
SODIUM: 138 mmol/L (ref 135–145)
Total Bilirubin: 0.3 mg/dL (ref 0.3–1.2)
Total Protein: 6.6 g/dL (ref 6.5–8.1)

## 2017-07-14 LAB — CBC
HCT: 35.2 % — ABNORMAL LOW (ref 36.0–46.0)
HEMOGLOBIN: 11.7 g/dL — AB (ref 12.0–15.0)
MCH: 26.5 pg (ref 26.0–34.0)
MCHC: 33.2 g/dL (ref 30.0–36.0)
MCV: 79.6 fL (ref 78.0–100.0)
PLATELETS: 198 10*3/uL (ref 150–400)
RBC: 4.42 MIL/uL (ref 3.87–5.11)
RDW: 13.4 % (ref 11.5–15.5)
WBC: 9.8 10*3/uL (ref 4.0–10.5)

## 2017-07-14 LAB — PROTEIN / CREATININE RATIO, URINE
Creatinine, Urine: 179 mg/dL
PROTEIN CREATININE RATIO: 0.09 mg/mg{creat} (ref 0.00–0.15)
Total Protein, Urine: 16 mg/dL

## 2017-07-14 MED ORDER — ACETAMINOPHEN 500 MG PO TABS
1000.0000 mg | ORAL_TABLET | Freq: Once | ORAL | Status: AC
  Administered 2017-07-14: 1000 mg via ORAL
  Filled 2017-07-14: qty 2

## 2017-07-14 NOTE — MAU Note (Signed)
Holly Glass is a 27 y.o. female,G3P2 at 36+2 weeks, sent from the office for PIH workup.She reports a headache today, now resolved with tylenol. Denies visual Sx or epigastric pain. In the office BP was 140/82 and urine was negative for protein. She is scheduled for repeat cesarean section with BTL at 39 weeks.  History OB History    Gravida  3   Para  2   Term  2   Preterm      AB      Living  2     SAB      TAB      Ectopic      Multiple  0   Live Births  2          Past Medical History:  Diagnosis Date  . Eczema   . Medical history non-contributory    Past Surgical History:  Procedure Laterality Date  . addenoidectomy    . CESAREAN SECTION N/A 02/03/2015   Procedure: CESAREAN SECTION;  Surgeon: Jaymes GraffNaima Dillard, MD;  Location: [facility];  Service: Obstetrics;  Laterality: N/A;  . CESAREAN SECTION N/A 07/30/2016   Procedure: CESAREAN SECTION;  Surgeon: Catalina AntiguaPeggy Constant, MD;  Location: [facility];  Service: Obstetrics;  Laterality: N/A;  . EYE SURGERY    . TONSILLECTOMY        Blood pressure 122/65, pulse 94, temperature 99.3 F (37.4 C), temperature source Oral, resp. rate 18, weight 104 kg (229 lb 4 oz), SpO2 100 %, unknown if currently breastfeeding.  General Appearance: Alert, appropriate appearance for age. No acute distress HEENT Exam: Grossly normal Gastrointestinal Exam: soft, non-tender, Uterus gravid with size compatible with [place] Psychiatric Exam: Alert and oriented, appropriate affect  Fetal monitoring: Category 1 and reviewed and reassuring   PIH labs: normal PCR: 0.09    Prenatal labs: ABO, Rh: --/--/O POS (05/01 16100822) Antibody: NEG (05/01 0822) Rubella:  non-immune RPR: Non Reactive (05/01 0822)  HBsAg:   negative HIV:   NR GBS: Negative (04/23 0000)   +++++++++++++++++++++++++++++++++++++++++++++++++++++++++++++++  Assessment and plan:  G3P2 at 36+2 weeks No evidence of preeclampsia Category 1 tracing D/C home  Next  appointment in office: 07/21/17 with ultrasound  Silverio LaySandra Aune Adami MD

## 2017-07-14 NOTE — Discharge Instructions (Signed)
Preeclampsia and Eclampsia °Preeclampsia is a serious condition that develops only during pregnancy. It is also called toxemia of pregnancy. This condition causes high blood pressure along with other symptoms, such as swelling and headaches. These symptoms may develop as the condition gets worse. Preeclampsia may occur at 20 weeks of pregnancy or later. °Diagnosing and treating preeclampsia early is very important. If not treated early, it can cause serious problems for you and your baby. One problem it can lead to is eclampsia, which is a condition that causes muscle jerking or shaking (convulsions or seizures) in the mother. Delivering your baby is the best treatment for preeclampsia or eclampsia. Preeclampsia and eclampsia symptoms usually go away after your baby is born. °What are the causes? °The cause of preeclampsia is not known. °What increases the risk? °The following risk factors make you more likely to develop preeclampsia: °· Being pregnant for the first time. °· Having had preeclampsia during a past pregnancy. °· Having a family history of preeclampsia. °· Having high blood pressure. °· Being pregnant with twins or triplets. °· Being 35 or older. °· Being African-American. °· Having kidney disease or diabetes. °· Having medical conditions such as lupus or blood diseases. °· Being very overweight (obese). ° °What are the signs or symptoms? °The earliest signs of preeclampsia are: °· High blood pressure. °· Increased protein in your urine. Your health care provider will check for this at every visit before you give birth (prenatal visit). ° °Other symptoms that may develop as the condition gets worse include: °· Severe headaches. °· Sudden weight gain. °· Swelling of the hands, face, legs, and feet. °· Nausea and vomiting. °· Vision problems, such as blurred or double vision. °· Numbness in the face, arms, legs, and feet. °· Urinating less than usual. °· Dizziness. °· Slurred speech. °· Abdominal pain,  especially upper abdominal pain. °· Convulsions or seizures. ° °Symptoms generally go away after giving birth. °How is this diagnosed? °There are no screening tests for preeclampsia. Your health care provider will ask you about symptoms and check for signs of preeclampsia during your prenatal visits. You may also have tests that include: °· Urine tests. °· Blood tests. °· Checking your blood pressure. °· Monitoring your baby’s heart rate. °· Ultrasound. ° °How is this treated? °You and your health care provider will determine the treatment approach that is best for you. Treatment may include: °· Having more frequent prenatal exams to check for signs of preeclampsia, if you have an increased risk for preeclampsia. °· Bed rest. °· Reducing how much salt (sodium) you eat. °· Medicine to lower your blood pressure. °· Staying in the hospital, if your condition is severe. There, treatment will focus on controlling your blood pressure and the amount of fluids in your body (fluid retention). °· You may need to take medicine (magnesium sulfate) to prevent seizures. This medicine may be given as an injection or through an IV tube. °· Delivering your baby early, if your condition gets worse. You may have your labor started with medicine (induced), or you may have a cesarean delivery. ° °Follow these instructions at home: °Eating and drinking ° °· Drink enough fluid to keep your urine clear or pale yellow. °· Eat a healthy diet that is low in sodium. Do not add salt to your food. Check nutrition labels to see how much sodium a food or beverage contains. °· Avoid caffeine. °Lifestyle °· Do not use any products that contain nicotine or tobacco, such as cigarettes   and e-cigarettes. If you need help quitting, ask your health care provider. °· Do not use alcohol or drugs. °· Avoid stress as much as possible. Rest and get plenty of sleep. °General instructions °· Take over-the-counter and prescription medicines only as told by your  health care provider. °· When lying down, lie on your side. This keeps pressure off of your baby. °· When sitting or lying down, raise (elevate) your feet. Try putting some pillows underneath your lower legs. °· Exercise regularly. Ask your health care provider what kinds of exercise are best for you. °· Keep all follow-up and prenatal visits as told by your health care provider. This is important. °How is this prevented? °To prevent preeclampsia or eclampsia from developing during another pregnancy: °· Get proper medical care during pregnancy. Your health care provider may be able to prevent preeclampsia or diagnose and treat it early. °· Your health care provider may have you take a low-dose aspirin or a calcium supplement during your next pregnancy. °· You may have tests of your blood pressure and kidney function after giving birth. °· Maintain a healthy weight. Ask your health care provider for help managing weight gain during pregnancy. °· Work with your health care provider to manage any long-term (chronic) health conditions you have, such as diabetes or kidney problems. ° °Contact a health care provider if: °· You gain more weight than expected. °· You have headaches. °· You have nausea or vomiting. °· You have abdominal pain. °· You feel dizzy or light-headed. °Get help right away if: °· You develop sudden or severe swelling anywhere in your body. This usually happens in the legs. °· You gain 5 lbs (2.3 kg) or more during one week. °· You have severe: °? Abdominal pain. °? Headaches. °? Dizziness. °? Vision problems. °? Confusion. °? Nausea or vomiting. °· You have a seizure. °· You have trouble moving any part of your body. °· You develop numbness in any part of your body. °· You have trouble speaking. °· You have any abnormal bleeding. °· You pass out. °This information is not intended to replace advice given to you by your health care provider. Make sure you discuss any questions you have with your health  care provider. °Document Released: 03/14/2000 Document Revised: 11/13/2015 Document Reviewed: 10/22/2015 °Elsevier Interactive Patient Education © 2018 [employer] ° °

## 2017-07-14 NOTE — MAU Note (Signed)
[facility] and they sent her over because her BP was high.  New problem, no prior hx of elevation with this preg or others. +HA (has not taken anything), denies visual changes, epigastric pain or increased swelling.

## 2017-07-20 ENCOUNTER — Telehealth (HOSPITAL_COMMUNITY): Payer: Self-pay | Admitting: *Deleted

## 2017-07-20 NOTE — Telephone Encounter (Signed)
Preadmission screen  

## 2017-07-21 ENCOUNTER — Encounter (HOSPITAL_COMMUNITY): Payer: Self-pay

## 2017-07-30 NOTE — Patient Instructions (Signed)
Holly Glass  07/30/2017   Your procedure is scheduled on:  08/03/2017  Enter through the Main Entrance of [facility] at 0930 AM.  Pick up the phone at the desk and dial [phone]  Call this number if you have problems the morning of surgery:[phone]  Remember:   Do not eat food:(After Midnight) Desps de medianoche.  Do not drink clear liquids: (After Midnight) Desps de medianoche.  Take these medicines the morning of surgery with A SIP OF WATER: none   Do not wear jewelry, make-up or nail polish.  Do not wear lotions, powders, or perfumes. Do not wear deodorant.  Do not shave 48 hours prior to surgery.  Do not bring valuables to the hospital.  [facility] is not   responsible for any belongings or valuables brought to the hospital.  Contacts, dentures or bridgework may not be worn into surgery.  Leave suitcase in the car. After surgery it may be brought to your room.  For patients admitted to the hospital, checkout time is 11:00 AM the day of              discharge.    N/A   Please read over the following fact sheets that you were given:   Surgical Site Infection Prevention

## 2017-07-31 ENCOUNTER — Encounter (HOSPITAL_COMMUNITY)
Admission: RE | Admit: 2017-07-31 | Discharge: 2017-07-31 | Disposition: A | Discharge status: Home / Self Care | Payer: Medicaid Other | Source: Ambulatory Visit | Attending: Obstetrics and Gynecology | Admitting: Obstetrics and Gynecology

## 2017-07-31 DIAGNOSIS — Z01812 Encounter for preprocedural laboratory examination: Secondary | ICD-10-CM | POA: Insufficient documentation

## 2017-07-31 HISTORY — DX: Other complications of anesthesia, initial encounter: T88.59XA

## 2017-07-31 HISTORY — DX: Other reaction to spinal and lumbar puncture: G97.1

## 2017-07-31 HISTORY — DX: Adverse effect of unspecified anesthetic, initial encounter: T41.45XA

## 2017-07-31 LAB — TYPE AND SCREEN
ABO/RH(D): O POS
Antibody Screen: NEGATIVE

## 2017-07-31 LAB — CBC
HCT: 36.5 % (ref 36.0–46.0)
Hemoglobin: 12 g/dL (ref 12.0–15.0)
MCH: 26.2 pg (ref 26.0–34.0)
MCHC: 32.9 g/dL (ref 30.0–36.0)
MCV: 79.7 fL (ref 78.0–100.0)
PLATELETS: 199 10*3/uL (ref 150–400)
RBC: 4.58 MIL/uL (ref 3.87–5.11)
RDW: 13.9 % (ref 11.5–15.5)
WBC: 9 10*3/uL (ref 4.0–10.5)

## 2017-08-01 ENCOUNTER — Encounter (HOSPITAL_COMMUNITY): Payer: Self-pay | Admitting: Obstetrics and Gynecology

## 2017-08-01 DIAGNOSIS — O0930 Supervision of pregnancy with insufficient antenatal care, unspecified trimester: Secondary | ICD-10-CM

## 2017-08-01 DIAGNOSIS — O09899 Supervision of other high risk pregnancies, unspecified trimester: Secondary | ICD-10-CM

## 2017-08-01 DIAGNOSIS — G971 Other reaction to spinal and lumbar puncture: Secondary | ICD-10-CM | POA: Diagnosis not present

## 2017-08-01 DIAGNOSIS — O9989 Other specified diseases and conditions complicating pregnancy, childbirth and the puerperium: Secondary | ICD-10-CM

## 2017-08-01 DIAGNOSIS — Z283 Underimmunization status: Secondary | ICD-10-CM

## 2017-08-01 DIAGNOSIS — O133 Gestational [pregnancy-induced] hypertension without significant proteinuria, third trimester: Secondary | ICD-10-CM | POA: Diagnosis present

## 2017-08-01 LAB — RPR: RPR Ser Ql: NONREACTIVE

## 2017-08-01 NOTE — H&P (Signed)
Holly Glass is a 27 y.o. female, G3P2002 at 97 1/6 weeks, presenting on 08/03/17 for scheduled repeat cesarean section, has been undecided regarding BTL, but consent signed on 06/17/17. She has been concerned regarding hx of spinal HA after last C/S.  She denies HA, visual sx, epigastric pain, contractions, leaking of fluid, or bleeding.  She reports positive FM.  Patient Active Problem List   Diagnosis Date Noted  . Rubella non-immune status, antepartum 08/01/2017  . Gestational hypertension w/o significant proteinuria in 3rd trimester 08/01/2017  . Late prenatal care--from 18 weeks 08/01/2017  . Obesity, Class III, BMI 40-49.9 (morbid obesity) (HCC)--BMI 44 08/01/2017  . Postoperative spinal headache   . H/O cesarean section x 2 01/16/2016  . Eczema 02/03/2015    History of present pregnancy: Patient entered care at 18 weeks.   EDC of 08/09/17 was established by LMP, and dating was congruent with initial [place] at 18 weeks.   Anatomy scan:  18 5/7 weeks, with limited anatomy findings and an anterior placenta, normal cervical length and fluid.   Additional [place] evaluations:  23 4/7 weeks:  Completion of anatomy, EFW 27%ile, anterior placenta, normal fluid, cervix 4.6 cm 37 2/7 weeks:  Vtx, EFW 6+9, 39%ile, AFI 55%ile Significant prenatal events: Entered care at 18 weeks, desired repeat C/S and initially desired BTL, but has been undecided since that time. Did sign BTL consent 06/17/17.  Declined genetic testing. Mild elevation of BP at 36 weeks, with PIH w/u and PCR negative, but received dx of gestational HTN.  Followed with biweekly NSTs, all reactive.  Received TDA 2/19/19P 136/78, weight 229, NST reactive.  NST 5/2 reactive.  OB History    Gravida  3   Para  2   Term  2   Preterm      AB      Living  2     SAB      TAB      Ectopic      Multiple  0   Live Births  2         2016--Primary LTCS, 41 5/7 weeks, NRFHR/oligohydramnios, female, 7+13, Dr. Normand Sloop  2018--Repeat  LTCS due to failed VBAC, NRFHR, 41 1/7 weeks, 7+10, female, Dr. Jolayne Panther, had spinal HA post-op, treated with po meds and oral caffeine intake.  Past Medical History:  Diagnosis Date  . Complication of anesthesia   . Eczema   . Medical history non-contributory   . Spinal headache    Past Surgical History:  Procedure Laterality Date  . addenoidectomy    . CESAREAN SECTION N/A 02/03/2015   Procedure: CESAREAN SECTION;  Surgeon: Jaymes Graff, MD;  Location: [facility];  Service: Obstetrics;  Laterality: N/A;  . CESAREAN SECTION N/A 07/30/2016   Procedure: CESAREAN SECTION;  Surgeon: Catalina Antigua, MD;  Location: [facility];  Service: Obstetrics;  Laterality: N/A;  . EYE SURGERY    . TONSILLECTOMY     Family History: family history includes Alcohol abuse in her paternal grandmother; Arthritis in her maternal grandmother; Birth defects in her maternal uncle; Cancer in her maternal grandfather; Heart disease in her maternal grandmother; Heart murmur in her sister; Hypertension in her father and mother; Miscarriages / [place] in her maternal grandmother.  Alzheimer's in maternal grandmother.  Social History:  reports that she has never smoked. She has never used smokeless tobacco. She reports that she does not drink alcohol or use drugs.  Patient is Tree surgeon, college-educated, is a Games developer, married to Sperry, who  is involved and supportive.   Prenatal Transfer Tool  Maternal Diabetes: No Genetic Screening: Declined Maternal Ultrasounds/Referrals: Normal Fetal Ultrasounds or other Referrals:  None Maternal Substance Abuse:  No Significant Maternal Medications:  None Significant Maternal Lab Results: None  TDAP 05/19/17 Flu NA  ROS:  Denies UCs, leaking, or bleeding.  Reports positive FM.  No HA, visual sx, or epigastric pain.  Allergies  Allergen Reactions  . Zyrtec Allergy [Cetirizine Hcl] Hives    Only Liquid zyrtec gives allergy       unknown if currently  breastfeeding.  Chest clear Heart RRR without murmur Abd gravid, NT, FH 38 weeks Pelvic: Deferred Ext: DTR 1+, no clonus, trace edema  FHR: NST reactive on 5/2 UCs:  None  Prenatal labs: ABO, Rh: --/--/O POS (05/03 1025) Antibody: NEG (05/03 1025) Rubella:  Nonimmune (12/11 0000) RPR: Non Reactive (05/03 1025)  HBsAg: Negative (12/11 0000)  HIV: Non-reactive (12/11 0000)  GBS:  Negative 07/14/17 Sickle cell/Hgb electrophoresis:  AA Pap:  Normal 05/2015 GC:  Negative 07/15/17 Chlamydia:  Negative 07/15/17 Genetic screenings:  Declined Glucola:  Normal Other:   Hgb 12.9 at NOB, 11.9 at 28 weeks, 11.5 at 38 weeks PIH labs and PCR WNL 07/14/17       Assessment/Plan: IUP at 39 1/7 weeks Prior C/S x 2, desires repeat BMI 44 GBS negative Rubella non-immune Late PNC at [redacted] weeks Gestational HTN in late 3rd trimester--neg PIH w/u. Hx spinal HA s/p last C/S.   Plan: Admit to [facility] on 08/03/17 per consult with Dr. Su Hilt for scheduled repeat C/S. Will confirm patient's decision regarding BTL at the time of admission. Anesthesia aware of patient's hx of spinal HA. Routine [facility] pre-op orders Will use clear OR drape per patient's request.  Nigel Bridgeman CNM, [place] 08/01/2017, 8:22 AM

## 2017-08-03 ENCOUNTER — Inpatient Hospital Stay (HOSPITAL_COMMUNITY): Payer: Medicaid Other | Admitting: Certified Registered Nurse Anesthetist

## 2017-08-03 ENCOUNTER — Encounter (HOSPITAL_COMMUNITY)
Admission: RE | Disposition: A | Discharge status: Home / Self Care | Payer: Self-pay | Source: Ambulatory Visit | Attending: Obstetrics and Gynecology

## 2017-08-03 ENCOUNTER — Inpatient Hospital Stay (HOSPITAL_COMMUNITY)
Admission: RE | Admit: 2017-08-03 | Discharge: 2017-08-06 | DRG: 785 | Disposition: A | Discharge status: Home / Self Care | Payer: Medicaid Other | Source: Ambulatory Visit | Attending: Obstetrics and Gynecology | Admitting: Obstetrics and Gynecology

## 2017-08-03 ENCOUNTER — Encounter (HOSPITAL_COMMUNITY): Payer: Self-pay | Admitting: *Deleted

## 2017-08-03 ENCOUNTER — Other Ambulatory Visit: Payer: Self-pay

## 2017-08-03 DIAGNOSIS — Z2839 Other underimmunization status: Secondary | ICD-10-CM

## 2017-08-03 DIAGNOSIS — Z302 Encounter for sterilization: Secondary | ICD-10-CM | POA: Diagnosis not present

## 2017-08-03 DIAGNOSIS — O34211 Maternal care for low transverse scar from previous cesarean delivery: Principal | ICD-10-CM | POA: Diagnosis present

## 2017-08-03 DIAGNOSIS — O99214 Obesity complicating childbirth: Secondary | ICD-10-CM | POA: Diagnosis present

## 2017-08-03 DIAGNOSIS — O9989 Other specified diseases and conditions complicating pregnancy, childbirth and the puerperium: Secondary | ICD-10-CM | POA: Diagnosis not present

## 2017-08-03 DIAGNOSIS — Z3A39 39 weeks gestation of pregnancy: Secondary | ICD-10-CM

## 2017-08-03 DIAGNOSIS — O134 Gestational [pregnancy-induced] hypertension without significant proteinuria, complicating childbirth: Secondary | ICD-10-CM | POA: Diagnosis present

## 2017-08-03 DIAGNOSIS — K5900 Constipation, unspecified: Secondary | ICD-10-CM | POA: Diagnosis not present

## 2017-08-03 DIAGNOSIS — O09899 Supervision of other high risk pregnancies, unspecified trimester: Secondary | ICD-10-CM

## 2017-08-03 DIAGNOSIS — O0930 Supervision of pregnancy with insufficient antenatal care, unspecified trimester: Secondary | ICD-10-CM

## 2017-08-03 DIAGNOSIS — Z98891 History of uterine scar from previous surgery: Secondary | ICD-10-CM

## 2017-08-03 DIAGNOSIS — G971 Other reaction to spinal and lumbar puncture: Secondary | ICD-10-CM | POA: Diagnosis not present

## 2017-08-03 DIAGNOSIS — O133 Gestational [pregnancy-induced] hypertension without significant proteinuria, third trimester: Secondary | ICD-10-CM | POA: Diagnosis present

## 2017-08-03 DIAGNOSIS — Z283 Underimmunization status: Secondary | ICD-10-CM

## 2017-08-03 DIAGNOSIS — E66813 Obesity, class 3: Secondary | ICD-10-CM | POA: Diagnosis present

## 2017-08-03 HISTORY — PX: BILATERAL SALPINGECTOMY: SHX5743

## 2017-08-03 SURGERY — Surgical Case
Anesthesia: Spinal | Site: Abdomen | Wound class: Clean Contaminated

## 2017-08-03 MED ORDER — NALOXONE HCL 0.4 MG/ML IJ SOLN: 0.4000 mg | INTRAMUSCULAR | Status: DC | PRN

## 2017-08-03 MED ORDER — SODIUM CHLORIDE 0.9 % IJ SOLN
INTRAMUSCULAR | Status: AC
  Filled 2017-08-03: qty 10

## 2017-08-03 MED ORDER — OXYTOCIN 40 UNITS IN LACTATED RINGERS INFUSION - SIMPLE MED
INTRAVENOUS | Status: DC | PRN
  Administered 2017-08-03: 400 mL via INTRAVENOUS
  Administered 2017-08-03: 100 mL via INTRAVENOUS

## 2017-08-03 MED ORDER — SIMETHICONE 80 MG PO CHEW
80.0000 mg | CHEWABLE_TABLET | ORAL | Status: DC
  Administered 2017-08-03 – 2017-08-05 (×3): 80 mg via ORAL
  Filled 2017-08-03 (×3): qty 1

## 2017-08-03 MED ORDER — PHENYLEPHRINE 8 MG IN D5W 100 ML (0.08MG/ML) PREMIX OPTIME
INJECTION | INTRAVENOUS | Status: DC | PRN
  Administered 2017-08-03: 60 ug/min via INTRAVENOUS

## 2017-08-03 MED ORDER — NALBUPHINE HCL 10 MG/ML IJ SOLN: 5.0000 mg | Freq: Once | INTRAMUSCULAR | Status: DC | PRN

## 2017-08-03 MED ORDER — MORPHINE SULFATE (PF) 0.5 MG/ML IJ SOLN
INTRAMUSCULAR | Status: DC | PRN
  Administered 2017-08-03: .2 mg via INTRATHECAL

## 2017-08-03 MED ORDER — ONDANSETRON HCL 4 MG/2ML IJ SOLN: 4.0000 mg | Freq: Four times a day (QID) | INTRAMUSCULAR | Status: DC | PRN

## 2017-08-03 MED ORDER — TETANUS-DIPHTH-ACELL PERTUSSIS 5-2.5-18.5 LF-MCG/0.5 IM SUSP: 0.5000 mL | Freq: Once | INTRAMUSCULAR | Status: DC

## 2017-08-03 MED ORDER — IBUPROFEN 600 MG PO TABS
600.0000 mg | ORAL_TABLET | Freq: Four times a day (QID) | ORAL | Status: DC
  Administered 2017-08-03 – 2017-08-06 (×10): 600 mg via ORAL
  Filled 2017-08-03 (×11): qty 1

## 2017-08-03 MED ORDER — FENTANYL CITRATE (PF) 100 MCG/2ML IJ SOLN
INTRAMUSCULAR | Status: DC | PRN
  Administered 2017-08-03: 10 ug via INTRATHECAL

## 2017-08-03 MED ORDER — OXYTOCIN 10 UNIT/ML IJ SOLN
INTRAMUSCULAR | Status: AC
  Filled 2017-08-03: qty 4

## 2017-08-03 MED ORDER — OXYCODONE HCL 5 MG PO TABS: 5.0000 mg | ORAL_TABLET | Freq: Once | ORAL | Status: DC | PRN

## 2017-08-03 MED ORDER — DIBUCAINE 1 % RE OINT: 1.0000 "application " | TOPICAL_OINTMENT | RECTAL | Status: DC | PRN

## 2017-08-03 MED ORDER — BUPIVACAINE IN DEXTROSE 0.75-8.25 % IT SOLN
INTRATHECAL | Status: DC | PRN
  Administered 2017-08-03: 1.8 mL via INTRATHECAL

## 2017-08-03 MED ORDER — OXYCODONE HCL 5 MG PO TABS
5.0000 mg | ORAL_TABLET | ORAL | Status: DC | PRN
  Administered 2017-08-04 – 2017-08-06 (×6): 5 mg via ORAL
  Filled 2017-08-03 (×4): qty 1

## 2017-08-03 MED ORDER — NALBUPHINE HCL 10 MG/ML IJ SOLN: 5.0000 mg | INTRAMUSCULAR | Status: DC | PRN

## 2017-08-03 MED ORDER — KETOROLAC TROMETHAMINE 30 MG/ML IJ SOLN: 30.0000 mg | Freq: Four times a day (QID) | INTRAMUSCULAR | Status: AC | PRN

## 2017-08-03 MED ORDER — SCOPOLAMINE 1 MG/3DAYS TD PT72
1.0000 | MEDICATED_PATCH | Freq: Once | TRANSDERMAL | Status: AC
  Administered 2017-08-03: 1.5 mg via TRANSDERMAL

## 2017-08-03 MED ORDER — FENTANYL CITRATE (PF) 100 MCG/2ML IJ SOLN
INTRAMUSCULAR | Status: AC
  Filled 2017-08-03: qty 2

## 2017-08-03 MED ORDER — OXYTOCIN 10 UNIT/ML IJ SOLN
INTRAVENOUS | Status: DC | PRN
  Administered 2017-08-03: 40 [IU] via INTRAVENOUS

## 2017-08-03 MED ORDER — DIPHENHYDRAMINE HCL 25 MG PO CAPS: 25.0000 mg | ORAL_CAPSULE | ORAL | Status: DC | PRN

## 2017-08-03 MED ORDER — SODIUM CHLORIDE 0.9% FLUSH: 3.0000 mL | INTRAVENOUS | Status: DC | PRN

## 2017-08-03 MED ORDER — KETOROLAC TROMETHAMINE 30 MG/ML IJ SOLN
30.0000 mg | Freq: Four times a day (QID) | INTRAMUSCULAR | Status: AC | PRN
  Administered 2017-08-03: 30 mg via INTRAMUSCULAR

## 2017-08-03 MED ORDER — PHENYLEPHRINE 8 MG IN D5W 100 ML (0.08MG/ML) PREMIX OPTIME
INJECTION | INTRAVENOUS | Status: AC
  Filled 2017-08-03: qty 100

## 2017-08-03 MED ORDER — OXYCODONE HCL 5 MG PO TABS
10.0000 mg | ORAL_TABLET | ORAL | Status: DC | PRN
  Administered 2017-08-05 (×2): 10 mg via ORAL
  Filled 2017-08-03 (×4): qty 2

## 2017-08-03 MED ORDER — MENTHOL 3 MG MT LOZG: 1.0000 | LOZENGE | OROMUCOSAL | Status: DC | PRN

## 2017-08-03 MED ORDER — MEPERIDINE HCL 25 MG/ML IJ SOLN: 6.2500 mg | INTRAMUSCULAR | Status: DC | PRN

## 2017-08-03 MED ORDER — METOCLOPRAMIDE HCL 5 MG/ML IJ SOLN
INTRAMUSCULAR | Status: DC | PRN
  Administered 2017-08-03: 10 mg via INTRAVENOUS

## 2017-08-03 MED ORDER — TRIAMCINOLONE ACETONIDE 40 MG/ML IJ SUSP
INTRAMUSCULAR | Status: DC | PRN
  Administered 2017-08-03: 40 mg via INTRAMUSCULAR

## 2017-08-03 MED ORDER — NALOXONE HCL 4 MG/10ML IJ SOLN
1.0000 ug/kg/h | INTRAVENOUS | Status: DC | PRN
  Filled 2017-08-03: qty 5

## 2017-08-03 MED ORDER — SCOPOLAMINE 1 MG/3DAYS TD PT72
MEDICATED_PATCH | TRANSDERMAL | Status: AC
  Filled 2017-08-03: qty 1

## 2017-08-03 MED ORDER — OXYTOCIN 40 UNITS IN LACTATED RINGERS INFUSION - SIMPLE MED: 2.5000 [IU]/h | INTRAVENOUS | Status: AC

## 2017-08-03 MED ORDER — CEFAZOLIN SODIUM-DEXTROSE 2-4 GM/100ML-% IV SOLN: 2.0000 g | INTRAVENOUS | Status: DC

## 2017-08-03 MED ORDER — ZOLPIDEM TARTRATE 5 MG PO TABS: 5.0000 mg | ORAL_TABLET | Freq: Every evening | ORAL | Status: DC | PRN

## 2017-08-03 MED ORDER — ONDANSETRON HCL 4 MG/2ML IJ SOLN: 4.0000 mg | Freq: Three times a day (TID) | INTRAMUSCULAR | Status: DC | PRN

## 2017-08-03 MED ORDER — OXYCODONE HCL 5 MG/5ML PO SOLN: 5.0000 mg | Freq: Once | ORAL | Status: DC | PRN

## 2017-08-03 MED ORDER — COCONUT OIL OIL: 1.0000 "application " | TOPICAL_OIL | Status: DC | PRN

## 2017-08-03 MED ORDER — SIMETHICONE 80 MG PO CHEW: 80.0000 mg | CHEWABLE_TABLET | ORAL | Status: DC | PRN

## 2017-08-03 MED ORDER — PROMETHAZINE HCL 25 MG/ML IJ SOLN
INTRAMUSCULAR | Status: AC
  Filled 2017-08-03: qty 1

## 2017-08-03 MED ORDER — LACTATED RINGERS IV SOLN
INTRAVENOUS | Status: DC
  Administered 2017-08-03 (×2): via INTRAVENOUS

## 2017-08-03 MED ORDER — MORPHINE SULFATE (PF) 0.5 MG/ML IJ SOLN
INTRAMUSCULAR | Status: AC
  Filled 2017-08-03: qty 10

## 2017-08-03 MED ORDER — DIPHENHYDRAMINE HCL 50 MG/ML IJ SOLN: 12.5000 mg | INTRAMUSCULAR | Status: DC | PRN

## 2017-08-03 MED ORDER — DIPHENHYDRAMINE HCL 25 MG PO CAPS: 25.0000 mg | ORAL_CAPSULE | Freq: Four times a day (QID) | ORAL | Status: DC | PRN

## 2017-08-03 MED ORDER — FENTANYL CITRATE (PF) 100 MCG/2ML IJ SOLN: 25.0000 ug | INTRAMUSCULAR | Status: DC | PRN

## 2017-08-03 MED ORDER — METOCLOPRAMIDE HCL 5 MG/ML IJ SOLN
INTRAMUSCULAR | Status: AC
  Filled 2017-08-03: qty 2

## 2017-08-03 MED ORDER — STERILE WATER FOR IRRIGATION IR SOLN
Status: DC | PRN
  Administered 2017-08-03: 1000 mL

## 2017-08-03 MED ORDER — ACETAMINOPHEN 325 MG PO TABS
650.0000 mg | ORAL_TABLET | ORAL | Status: DC | PRN
  Administered 2017-08-03 – 2017-08-06 (×2): 650 mg via ORAL
  Filled 2017-08-03 (×2): qty 2

## 2017-08-03 MED ORDER — PRENATAL MULTIVITAMIN CH
1.0000 | ORAL_TABLET | Freq: Every day | ORAL | Status: DC
  Administered 2017-08-04 – 2017-08-05 (×2): 1 via ORAL
  Filled 2017-08-03 (×2): qty 1

## 2017-08-03 MED ORDER — LACTATED RINGERS IV SOLN
INTRAVENOUS | Status: DC
  Administered 2017-08-03: 11:00:00 via INTRAVENOUS

## 2017-08-03 MED ORDER — ONDANSETRON HCL 4 MG/2ML IJ SOLN
INTRAMUSCULAR | Status: DC | PRN
  Administered 2017-08-03: 4 mg via INTRAVENOUS

## 2017-08-03 MED ORDER — SIMETHICONE 80 MG PO CHEW
80.0000 mg | CHEWABLE_TABLET | Freq: Three times a day (TID) | ORAL | Status: DC
  Administered 2017-08-04 – 2017-08-05 (×5): 80 mg via ORAL
  Filled 2017-08-03 (×5): qty 1

## 2017-08-03 MED ORDER — ONDANSETRON HCL 4 MG/2ML IJ SOLN
INTRAMUSCULAR | Status: AC
  Filled 2017-08-03: qty 2

## 2017-08-03 MED ORDER — TRIAMCINOLONE ACETONIDE 40 MG/ML IJ SUSP
40.0000 mg | Freq: Once | INTRAMUSCULAR | Status: DC
  Filled 2017-08-03: qty 1

## 2017-08-03 MED ORDER — CEFAZOLIN SODIUM-DEXTROSE 2-4 GM/100ML-% IV SOLN
2.0000 g | INTRAVENOUS | Status: AC
  Administered 2017-08-03: 2 g via INTRAVENOUS
  Filled 2017-08-03: qty 100

## 2017-08-03 MED ORDER — SODIUM CHLORIDE 0.9 % IR SOLN
Status: DC | PRN
  Administered 2017-08-03: 1000 mL

## 2017-08-03 MED ORDER — OXYTOCIN 10 UNIT/ML IJ SOLN: INTRAMUSCULAR | Status: DC | PRN

## 2017-08-03 MED ORDER — WITCH HAZEL-GLYCERIN EX PADS: 1.0000 "application " | MEDICATED_PAD | CUTANEOUS | Status: DC | PRN

## 2017-08-03 MED ORDER — PROMETHAZINE HCL 25 MG/ML IJ SOLN
12.5000 mg | Freq: Once | INTRAMUSCULAR | Status: AC | PRN
Start: 2017-08-03 — End: 2017-08-03
  Administered 2017-08-03: 12.5 mg via INTRAVENOUS

## 2017-08-03 MED ORDER — LACTATED RINGERS IV SOLN
INTRAVENOUS | Status: DC
  Administered 2017-08-03: 22:00:00 via INTRAVENOUS

## 2017-08-03 MED ORDER — SENNOSIDES-DOCUSATE SODIUM 8.6-50 MG PO TABS
2.0000 | ORAL_TABLET | ORAL | Status: DC
  Administered 2017-08-03 – 2017-08-05 (×3): 2 via ORAL
  Filled 2017-08-03 (×3): qty 2

## 2017-08-03 MED ORDER — KETOROLAC TROMETHAMINE 30 MG/ML IJ SOLN
INTRAMUSCULAR | Status: AC
  Filled 2017-08-03: qty 1

## 2017-08-03 MED ORDER — DEXAMETHASONE SODIUM PHOSPHATE 4 MG/ML IJ SOLN
INTRAMUSCULAR | Status: DC | PRN
  Administered 2017-08-03: 4 mg via INTRAVENOUS

## 2017-08-03 SURGICAL SUPPLY — 37 items
BENZOIN TINCTURE PRP APPL 2/3 (GAUZE/BANDAGES/DRESSINGS) ×4 IMPLANT
CHLORAPREP W/TINT 26ML (MISCELLANEOUS) ×4 IMPLANT
CLAMP CORD UMBIL (MISCELLANEOUS) ×4 IMPLANT
CLOSURE WOUND 1/2 X4 (GAUZE/BANDAGES/DRESSINGS) ×1
CLOTH BEACON ORANGE TIMEOUT ST (SAFETY) ×4 IMPLANT
DRAPE C SECTION CLR SCREEN (DRAPES) ×4 IMPLANT
DRSG OPSITE POSTOP 4X10 (GAUZE/BANDAGES/DRESSINGS) ×4 IMPLANT
ELECT REM PT RETURN 9FT ADLT (ELECTROSURGICAL) ×4
ELECTRODE REM PT RTRN 9FT ADLT (ELECTROSURGICAL) ×2 IMPLANT
GLOVE BIO SURGEON STRL SZ7.5 (GLOVE) ×4 IMPLANT
GLOVE BIOGEL PI IND STRL 7.0 (GLOVE) ×2 IMPLANT
GLOVE BIOGEL PI IND STRL 7.5 (GLOVE) ×2 IMPLANT
GLOVE BIOGEL PI INDICATOR 7.0 (GLOVE) ×2
GLOVE BIOGEL PI INDICATOR 7.5 (GLOVE) ×2
GOWN STRL REUS W/TWL LRG LVL3 (GOWN DISPOSABLE) ×8 IMPLANT
KIT ABG SYR 3ML LUER SLIP (SYRINGE) IMPLANT
NDL SAFETY ECLIPSE 18X1.5 (NEEDLE) ×2 IMPLANT
NEEDLE HYPO 18GX1.5 SHARP (NEEDLE) ×2
NEEDLE HYPO 22GX1.5 SAFETY (NEEDLE) ×4 IMPLANT
NS IRRIG 1000ML POUR BTL (IV SOLUTION) ×4 IMPLANT
PACK C SECTION WH (CUSTOM PROCEDURE TRAY) ×4 IMPLANT
PAD OB MATERNITY 4.3X12.25 (PERSONAL CARE ITEMS) ×4 IMPLANT
PENCIL SMOKE EVAC W/HOLSTER (ELECTROSURGICAL) ×4 IMPLANT
RTRCTR C-SECT PINK 25CM LRG (MISCELLANEOUS) ×4 IMPLANT
STRIP CLOSURE SKIN 1/2X4 (GAUZE/BANDAGES/DRESSINGS) ×3 IMPLANT
SUT CHROMIC 2 0 CT 1 (SUTURE) ×4 IMPLANT
SUT MNCRL AB 3-0 PS2 27 (SUTURE) ×4 IMPLANT
SUT PLAIN 2 0 XLH (SUTURE) ×4 IMPLANT
SUT VIC AB 0 CT1 36 (SUTURE) ×4 IMPLANT
SUT VIC AB 0 CTX 36 (SUTURE) ×6
SUT VIC AB 0 CTX36XBRD ANBCTRL (SUTURE) ×6 IMPLANT
SUT VIC AB 2-0 SH 27 (SUTURE) ×10
SUT VIC AB 2-0 SH 27XBRD (SUTURE) ×10 IMPLANT
SYR 10ML LL (SYRINGE) ×4 IMPLANT
SYR CONTROL 10ML LL (SYRINGE) ×4 IMPLANT
TOWEL OR 17X24 6PK STRL BLUE (TOWEL DISPOSABLE) ×4 IMPLANT
TRAY FOLEY W/BAG SLVR 14FR LF (SET/KITS/TRAYS/PACK) ×4 IMPLANT

## 2017-08-03 NOTE — Anesthesia Preprocedure Evaluation (Signed)
Anesthesia Evaluation  Patient identified by MRN, date of birth, ID band Patient awake    Reviewed: Allergy & Precautions, H&P , NPO status , Patient's Chart, lab work & pertinent test results  History of Anesthesia Complications (+) POST - OP SPINAL HEADACHE and history of anesthetic complications  Airway Mallampati: II   Neck ROM: full    Dental   Pulmonary neg pulmonary ROS,    breath sounds clear to auscultation       Cardiovascular negative cardio ROS   Rhythm:regular Rate:Normal     Neuro/Psych  Headaches,    GI/Hepatic   Endo/Other  Morbid obesity  Renal/GU      Musculoskeletal   Abdominal   Peds  Hematology   Anesthesia Other Findings   Reproductive/Obstetrics (+) Pregnancy                             Anesthesia Physical Anesthesia Plan  ASA: II  Anesthesia Plan: Spinal   Post-op Pain Management:    Induction: Intravenous  PONV Risk Score and Plan: 2 and Ondansetron, Scopolamine patch - Pre-op and Treatment may vary due to age or medical condition  Airway Management Planned: Nasal Cannula  Additional Equipment:   Intra-op Plan:   Post-operative Plan:   Informed Consent: I have reviewed the patients History and Physical, chart, labs and discussed the procedure including the risks, benefits and alternatives for the proposed anesthesia with the patient or authorized representative who has indicated his/her understanding and acceptance.     Plan Discussed with: CRNA, Anesthesiologist and Surgeon  Anesthesia Plan Comments:         Anesthesia Quick Evaluation

## 2017-08-03 NOTE — Anesthesia Postprocedure Evaluation (Signed)
Anesthesia Post Note  Patient: Holly Glass  Procedure(s) Performed: REPEAT CESAREAN SECTION (N/A Abdomen) BILATERAL SALPINGECTOMY (Bilateral Abdomen)     Patient location during evaluation: PACU Anesthesia Type: Spinal Level of consciousness: oriented and awake and alert Pain management: pain level controlled Vital Signs Assessment: post-procedure vital signs reviewed and stable Respiratory status: spontaneous breathing, respiratory function stable and patient connected to nasal cannula oxygen Cardiovascular status: blood pressure returned to baseline and stable Postop Assessment: no headache, no backache and no apparent nausea or vomiting Anesthetic complications: no    Last Vitals:  Vitals:   08/03/17 1514 08/03/17 1620  BP: 131/73 127/81  Pulse: 63 61  Resp: 18 20  Temp: (!) 36.4 C 37.1 C  SpO2: 98% 97%    Last Pain:  Vitals:   08/03/17 1620  TempSrc:   PainSc: 0-No pain   Pain Goal:                 Assyria Morreale S

## 2017-08-03 NOTE — Op Note (Signed)
Cesarean Section Procedure Note  Indications: P2 at 43 1/7wks admitted for repeat c-section and sterilization  Pre-operative Diagnosis: 1.39 1/7 wks 2.Prior Cesarean Section 3.desires sterilization   Post-operative Diagnosis: 1.39 1/7wks 2.Prior Cesarean Section 3.desires sterilization  Procedure: 1.REPEAT CESAREAN SECTION 2.BILATERAL SALPINGECTOMY  Surgeon: Osborn Coho, MD    Assistants: Nigel Bridgeman, CNM Kathalene Frames scrubbed in as well)  Anesthesia: Spinal  Anesthesiologist: Achille Rich, MD   Procedure Details  The patient was taken to the operating room after the risks, benefits, complications, treatment options, and expected outcomes were discussed with the patient.  The patient concurred with the proposed plan, giving informed consent which was signed and witnessed. The patient was taken to Operating Room Nine (C-Section Suite), identified as Holly Glass and the procedure verified as C-Section Delivery. A Time Out was held and the above information confirmed.  After induction of anesthesia by obtaining a surgical level via the spinal, the patient was prepped and draped in the usual sterile manner. A Pfannenstiel skin incision was made and carried down through the subcutaneous tissue to the underlying layer of fascia.  The fascia was incised bilaterally and extended transversely bilaterally with the Mayo scissors. Kocher clamps were placed on the inferior aspect of the fascial incision and the underlying rectus muscle was separated from the fascia. The same was done on the superior aspect of the fascial incision through dense scar tissue.  The peritoneum was identified, entered bluntly and extended manually. An Alexis self-retaining retractor was placed.  The utero-vesical peritoneal reflection was incised transversely and the bladder flap was bluntly freed from the lower uterine segment. A low transverse uterine incision was made with the scalpel and extended bilaterally with the  bandage scissors.  The infant was delivered in vertex position without difficulty. After the umbilical cord was clamped and cut, the infant was handed to the awaiting pediatricians.  Cord blood was obtained for evaluation.  The placenta was removed intact and appeared to be within normal limits. The uterus was cleared of all clots and debris. The uterine incision was closed with running interlocking sutures of 0 Vicryl and a second imbricating layer was performed as well.   Bilateral tubes and ovaries appeared to be within normal limits.  Good hemostasis was noted.  Copious irrigation was performed until clear.  The uterus was exteriorized and the left fallopian tube grasped in the midportion with a babcock after carrying it out to its fimbriated end and ligated with two 2-0 plain ties.  The tube was excised and the remaining pedicle cauterized with the bovie. The same was done on the contralateral side.  The peritoneum was repaired with 2-0 chromic via a running suture.  The fascia was reapproximated with a running suture of 0 Vicryl. The subcutaneous tissue was reapproximated with 3 interrupted sutures of 2-0 plain.  The skin was reapproximated with a subcuticular suture of 3-0 monocryl.  Steristrips were applied.  Instrument, sponge, and needle counts were correct prior to abdominal closure and at the conclusion of the case.  The patient was awaiting transfer to the recovery room in good condition.  Findings: Live female infant with Apgars 8 at one minute and 9 at five minutes.  Normal appearing bilateral ovaries and fallopian tubes were noted.  Estimated Blood Loss:  774 ml         Drains: foley to gravity 150 cc         Total IV Fluids: 1900 ml  Specimens to Pathology: Bilateral Portion of Tubes (placenta to L&D)         Complications:  None; patient tolerated the procedure well.         Disposition: PACU - hemodynamically stable.         Condition: stable  Attending Attestation: I  performed the procedure.

## 2017-08-03 NOTE — Transfer of Care (Signed)
Immediate Anesthesia Transfer of Care Note  Patient: Holly Glass  Procedure(s) Performed: REPEAT CESAREAN SECTION (N/A Abdomen) BILATERAL SALPINGECTOMY (Bilateral Abdomen)  Patient Location: PACU  Anesthesia Type:Spinal  Level of Consciousness: awake, alert  and oriented  Airway & Oxygen Therapy: Patient Spontanous Breathing  Post-op Assessment: Report given to RN and Post -op Vital signs reviewed and stable  Post vital signs: Reviewed and stable  Last Vitals:  Vitals Value Taken Time  BP 145/82 08/03/2017  2:00 PM  Temp    Pulse 83 08/03/2017  2:02 PM  Resp 12 08/03/2017  2:02 PM  SpO2 98 % 08/03/2017  2:02 PM  Vitals shown include unvalidated device data.  Last Pain:  Vitals:   08/03/17 1013  PainSc: 0-No pain         Complications: No apparent anesthesia complications

## 2017-08-03 NOTE — Anesthesia Postprocedure Evaluation (Signed)
Anesthesia Post Note  Patient: Holly Glass  Procedure(s) Performed: REPEAT CESAREAN SECTION (N/A Abdomen) BILATERAL SALPINGECTOMY (Bilateral Abdomen)     Patient location during evaluation: Mother Baby Anesthesia Type: Spinal Level of consciousness: awake and alert and oriented Pain management: satisfactory to patient Vital Signs Assessment: post-procedure vital signs reviewed and stable Respiratory status: respiratory function stable and spontaneous breathing Cardiovascular status: blood pressure returned to baseline Postop Assessment: no headache, no backache, spinal receding, patient able to bend at knees and adequate PO intake Anesthetic complications: no    Last Vitals:  Vitals:   08/03/17 1847 08/03/17 2100  BP: 128/85   Pulse: 71   Resp: 20   Temp: 37.6 C   SpO2: 97% 97%    Last Pain:  Vitals:   08/03/17 2023  TempSrc:   PainSc: 1    Pain Goal:                 Vaneta Hammontree

## 2017-08-03 NOTE — Anesthesia Procedure Notes (Signed)
Spinal  Patient location during procedure: OR Start time: 08/03/2017 11:54 AM End time: 08/03/2017 11:59 AM Staffing Anesthesiologist: Achille Rich, MD Performed: anesthesiologist  Preanesthetic Checklist Completed: patient identified, surgical consent, pre-op evaluation, timeout performed, IV checked, risks and benefits discussed and monitors and equipment checked Spinal Block Patient position: sitting Prep: DuraPrep Patient monitoring: cardiac monitor, continuous pulse ox and blood pressure Approach: midline Location: L3-4 Injection technique: single-shot Needle Needle type: Pencan  Needle gauge: 24 G Needle length: 9 cm Needle insertion depth: 8 cm Assessment Sensory level: T10 Additional Notes Functioning IV was confirmed and monitors were applied. Sterile prep and drape, including hand hygiene and sterile gloves were used. The patient was positioned and the spine was prepped. The skin was anesthetized with lidocaine.  Free flow of clear CSF was obtained prior to injecting local anesthetic into the CSF.  The spinal needle aspirated freely following injection.  The needle was carefully withdrawn.  The patient tolerated the procedure well.

## 2017-08-03 NOTE — Addendum Note (Signed)
Addendum  created 08/03/17 2234 by Graciela Husbands, CRNA   Sign clinical note

## 2017-08-04 ENCOUNTER — Encounter (HOSPITAL_COMMUNITY): Payer: Self-pay | Admitting: Obstetrics and Gynecology

## 2017-08-04 LAB — BIRTH TISSUE RECOVERY COLLECTION (PLACENTA DONATION)

## 2017-08-04 LAB — CBC
HEMATOCRIT: 34.7 % — AB (ref 36.0–46.0)
HEMOGLOBIN: 11.3 g/dL — AB (ref 12.0–15.0)
MCH: 25.6 pg — ABNORMAL LOW (ref 26.0–34.0)
MCHC: 32.6 g/dL (ref 30.0–36.0)
MCV: 78.5 fL (ref 78.0–100.0)
Platelets: 228 10*3/uL (ref 150–400)
RBC: 4.42 MIL/uL (ref 3.87–5.11)
RDW: 13.9 % (ref 11.5–15.5)
WBC: 14.9 10*3/uL — AB (ref 4.0–10.5)

## 2017-08-04 MED ORDER — MEASLES, MUMPS & RUBELLA VAC ~~LOC~~ INJ
0.5000 mL | INJECTION | Freq: Once | SUBCUTANEOUS | Status: AC
  Administered 2017-08-04: 0.5 mL via SUBCUTANEOUS
  Filled 2017-08-04 (×2): qty 0.5

## 2017-08-04 NOTE — Progress Notes (Signed)
Subjective: Postpartum Day 1: Cesarean Delivery Patient reports tolerating PO, + flatus and no problems voiding.  Pt in room resting with partner and baby in arms. Pt tolerates ambulation. Pt endorses having cramps and incisional pain upon movement but tolerates pain with medication.   Objective: Vital signs in last 24 hours: Temp:  [97.5 F (36.4 C)-99.6 F (37.6 C)] 99 F (37.2 C) (05/07 0629) Pulse Rate:  [61-71] 68 (05/07 0629) Resp:  [18-20] 18 (05/07 0629) BP: (114-136)/(73-85) 114/80 (05/07 0629) SpO2:  [96 %-100 %] 97 % (05/07 0629)  Physical Exam:  General: alert, cooperative and appears stated age Lochia: appropriate Uterine Fundus: firm Incision: healing well, no significant drainage, no dehiscence, no significant erythema DVT Evaluation: No evidence of DVT seen on physical exam. Negative Homan's sign. No cords or calf tenderness.  Recent Labs    08/04/17 0624  HGB 11.3*  HCT 34.7*    Assessment/Plan: Status post Cesarean section. Doing well postoperatively.  Continue current care.   Rubella Non-immune, pt to receive vaccine before discharge HOME.  CONSTIPATION: Pt to take stool softer and mirilax daily. Increase fiber, and water, Pt to attempt to ambulate more frequently.    Holly Glass 08/04/2017, 2:45 PM

## 2017-08-05 ENCOUNTER — Encounter (HOSPITAL_COMMUNITY): Payer: Self-pay | Admitting: *Deleted

## 2017-08-05 NOTE — Progress Notes (Signed)
Subjective: Postpartum Day 2: Cesarean Delivery Patient reports tolerating PO, + flatus and no problems voiding.  Pt in room resting with partner and baby in arms. Pt tolerates ambulation. Pt endorses having cramps and incisional pain upon movement but tolerates pain with medication. Pt decided to bottle feed infant only.  Objective: Vital signs in last 24 hours: Temp:  [98 F (36.7 C)-98.1 F (36.7 C)] 98 F (36.7 C) (05/08 0557) Pulse Rate:  [70-71] 70 (05/08 0557) Resp:  [18] 18 (05/08 0557) BP: (132)/(76-81) 132/76 (05/08 0557) SpO2:  [98 %] 98 % (05/08 0557)  Physical Exam:  General: alert, cooperative and appears stated age Lochia: appropriate Uterine Fundus: firm Incision: healing well, no significant drainage, no dehiscence, no significant erythema, honey comb dressing intact, clean and dry.  DVT Evaluation: No evidence of DVT seen on physical exam. Negative Homan's sign. No cords or calf tenderness.  Recent Labs    08/04/17 0624  HGB 11.3*  HCT 34.7*    Assessment/Plan: Status post Cesarean section. Doing well postoperatively.  Continue current care. Pt to be d/c home in AM.   Rubella Non-immune, pt to receive vaccine before discharge HOME.  CONSTIPATION: Pt to take stool softer and mirilax daily. Increase fiber, and water, Pt to attempt to ambulate more frequently.    Everline Mahaffy 08/05/2017, 1:08 PM

## 2017-08-06 ENCOUNTER — Encounter (HOSPITAL_COMMUNITY): Payer: Self-pay | Admitting: *Deleted

## 2017-08-06 MED ORDER — OXYCODONE HCL 5 MG PO TABS: 5.0000 mg | ORAL_TABLET | ORAL | 0 refills | Status: DC | PRN

## 2017-08-06 MED ORDER — IBUPROFEN 600 MG PO TABS: 600.0000 mg | ORAL_TABLET | Freq: Four times a day (QID) | ORAL | 0 refills | Status: DC

## 2017-08-06 NOTE — Discharge Summary (Signed)
OB Discharge Summary     Patient Name: Holly Glass DOB: 1990/05/19 MRN: [id number]  Date of admission: 08/03/2017 Delivering MD: Osborn Coho   Date of discharge: 08/06/2017  Admitting diagnosis: Prior Cesarean Section Intrauterine pregnancy: [redacted]w[redacted]d     Secondary diagnosis:  Active Problems:   Rubella non-immune status, antepartum   Gestational hypertension w/o significant proteinuria in 3rd trimester   Late prenatal care--from 18 weeks   Obesity, Class III, BMI 40-49.9 (morbid obesity) (HCC)--BMI 44   Status post repeat low transverse cesarean section  Additional problems: none      Discharge diagnosis: Term Pregnancy Delivered                                                                                                Post partum procedures:none  Augmentation: none  Complications: None  Hospital course:  Induction of Labor With Cesarean Section  27 y.o. yo G3P2002 at [redacted]w[redacted]d was admitted to the hospital 08/03/2017 for induction of labor. Patient had a labor course significant for none. The patient went for cesarean section due to Elective Repeat, and delivered a Viable infant,08/03/2017  Membrane Rupture Time/Date: 12:30 PM ,08/03/2017   Details of operation can be found in separate operative Note.  Patient had an uncomplicated postpartum course. She is ambulating, tolerating a regular diet, passing flatus, and urinating well.  Patient is discharged home in stable condition on 08/06/17.                                    Physical exam  Vitals:   08/04/17 1827 08/05/17 0557 08/05/17 1707 08/06/17 0540  BP: 132/81 132/76 133/83 126/81  Pulse: 71 70  73  Resp: Temp: 98.1 F (36.7 C) 98 F (36.7 C) 98.4 F (36.9 C) 97.6 F (36.4 C)  TempSrc: Oral Oral Oral Oral  SpO2:  98% 99% 100%  Weight:      Height:       General: alert, cooperative and no distress Lochia: appropriate Uterine Fundus: firm Incision: Healing well with no significant drainage, No  significant erythema, Dressing is clean, dry, and intact DVT Evaluation: No evidence of DVT seen on physical exam. Negative Homan's sign. No cords or calf tenderness. No significant calf/ankle edema. Labs: Lab Results  Component Value Date   WBC 14.9 (H) 08/04/2017   HGB 11.3 (L) 08/04/2017   HCT 34.7 (L) 08/04/2017   MCV 78.5 08/04/2017   PLT 228 08/04/2017   CMP Latest Ref Rng & Units 07/14/2017  Glucose 65 - 99 mg/dL 409(W)  BUN 6 - 20 mg/dL 7  Creatinine 1.19 - 1.47 mg/dL 8.29  Sodium 562 - 130 mmol/L 138  Potassium 3.5 - 5.1 mmol/L 3.5  Chloride 101 - 111 mmol/L 109  CO2 22 - 32 mmol/L 19(L)  Calcium 8.9 - 10.3 mg/dL 8.3(L)  Total Protein 6.5 - 8.1 g/dL 6.6  Total Bilirubin 0.3 - 1.2 mg/dL 0.3  Alkaline Phos 38 - 126 U/L 125  AST 15 - 41  U/L 18  ALT 14 - 54 U/L 15    Discharge instruction: per After Visit Summary and "Baby and Me Booklet".  After visit meds:  Allergies as of 08/06/2017      Reactions   Zyrtec Allergy [cetirizine Hcl] Hives   Only Liquid zyrtec gives allergy      Medication List    STOP taking these medications   acetaminophen 325 MG tablet Commonly known as:  TYLENOL     TAKE these medications   ibuprofen 600 MG tablet Commonly known as:  ADVIL,MOTRIN Take 1 tablet (600 mg total) by mouth every 6 (six) hours.   oxyCODONE 5 MG immediate release tablet Commonly known as:  Oxy IR/ROXICODONE Take 1 tablet (5 mg total) by mouth every 4 (four) hours as needed (pain scale 4-7).   prenatal vitamin w/FE, FA 27-1 MG Tabs tablet Take 1 tablet by mouth daily.            Discharge Care Instructions  (From admission, onward)        Start     Ordered   08/06/17 0000  Discharge wound care:    Comments:  Take dressing off on day 5-7 postpartum.  Report increased drainage, redness or warmth. Clean with water, let soap trickle down body. Can leave steri strips on until they fall off or take them off gently at day 10. Keep open to air, clean and  dry.   08/06/17 0719      Diet: routine diet  Activity: Advance as tolerated. Pelvic rest for 6 weeks.   Outpatient follow up:6 weeks Follow up Appt:No future appointments. Follow up Visit:No follow-ups on file.  Postpartum contraception: Undecided  Newborn Data: Live born female  Birth Weight: 6 lb 11.8 oz (3055 g) APGAR: 8, 9  Newborn Delivery   Birth date/time:  08/03/2017 12:30:00 Delivery type:  C-Section, Low Transverse Trial of labor:  No C-section categorization:  Repeat     Baby Feeding: Bottle Disposition:home with mother   Pt discharged in stable condition    08/06/2017 Dale [place], FNP

## 2017-08-22 ENCOUNTER — Inpatient Hospital Stay (HOSPITAL_COMMUNITY)
Admission: AD | Admit: 2017-08-22 | Discharge: 2017-08-22 | Disposition: A | Discharge status: Home / Self Care | Payer: Medicaid Other | Source: Ambulatory Visit | Attending: Obstetrics & Gynecology | Admitting: Obstetrics & Gynecology

## 2017-08-22 ENCOUNTER — Encounter (HOSPITAL_COMMUNITY): Payer: Self-pay

## 2017-08-22 DIAGNOSIS — Z9889 Other specified postprocedural states: Secondary | ICD-10-CM | POA: Diagnosis not present

## 2017-08-22 DIAGNOSIS — R350 Frequency of micturition: Secondary | ICD-10-CM | POA: Diagnosis present

## 2017-08-22 DIAGNOSIS — N907 Vulvar cyst: Secondary | ICD-10-CM

## 2017-08-22 DIAGNOSIS — O8620 Urinary tract infection following delivery, unspecified: Secondary | ICD-10-CM | POA: Insufficient documentation

## 2017-08-22 DIAGNOSIS — N368 Other specified disorders of urethra: Secondary | ICD-10-CM

## 2017-08-22 DIAGNOSIS — O8629 Other urinary tract infection following delivery: Secondary | ICD-10-CM | POA: Diagnosis not present

## 2017-08-22 HISTORY — DX: Vulvar cyst: N90.7

## 2017-08-22 LAB — URINALYSIS, ROUTINE W REFLEX MICROSCOPIC
BILIRUBIN URINE: NEGATIVE
Glucose, UA: NEGATIVE mg/dL
Ketones, ur: NEGATIVE mg/dL
Nitrite: POSITIVE — AB
Protein, ur: NEGATIVE mg/dL
Specific Gravity, Urine: 1.017 (ref 1.005–1.030)
WBC, UA: >50 WBC/hpf — ABNORMAL HIGH (ref 0–5)
pH: 6 (ref 5.0–8.0)

## 2017-08-22 MED ORDER — NITROFURANTOIN MONOHYD MACRO 100 MG PO CAPS: 100.0000 mg | ORAL_CAPSULE | Freq: Two times a day (BID) | ORAL | 0 refills | Status: DC

## 2017-08-22 NOTE — Discharge Instructions (Signed)
Urinary Tract Infection, Adult A urinary tract infection (UTI) is an infection of any part of the urinary tract, which includes the kidneys, ureters, bladder, and urethra. These organs make, store, and get rid of urine in the body. UTI can be a bladder infection (cystitis) or kidney infection (pyelonephritis). What are the causes? This infection may be caused by fungi, viruses, or bacteria. Bacteria are the most common cause of UTIs. This condition can also be caused by repeated incomplete emptying of the bladder during urination. What increases the risk? This condition is more likely to develop if:  You ignore your need to urinate or hold urine for long periods of time.  You do not empty your bladder completely during urination.  You wipe back to front after urinating or having a bowel movement, if you are female.  You are uncircumcised, if you are female.  You are constipated.  You have a urinary catheter that stays in place (indwelling).  You have a weak defense (immune) system.  You have a medical condition that affects your bowels, kidneys, or bladder.  You have diabetes.  You take antibiotic medicines frequently or for long periods of time, and the antibiotics no longer work well against certain types of infections (antibiotic resistance).  You take medicines that irritate your urinary tract.  You are exposed to chemicals that irritate your urinary tract.  You are female.  What are the signs or symptoms? Symptoms of this condition include:  Fever.  Frequent urination or passing small amounts of urine frequently.  Needing to urinate urgently.  Pain or burning with urination.  Urine that smells bad or unusual.  Cloudy urine.  Pain in the lower abdomen or back.  Trouble urinating.  Blood in the urine.  Vomiting or being less hungry than normal.  Diarrhea or abdominal pain.  Vaginal discharge, if you are female.  How is this diagnosed? This condition is  diagnosed with a medical history and physical exam. You will also need to provide a urine sample to test your urine. Other tests may be done, including:  Blood tests.  Sexually transmitted disease (STD) testing.  If you have had more than one UTI, a cystoscopy or imaging studies may be done to determine the cause of the infections. How is this treated? Treatment for this condition often includes a combination of two or more of the following:  Antibiotic medicine.  Other medicines to treat less common causes of UTI.  Over-the-counter medicines to treat pain.  Drinking enough water to stay hydrated.  Follow these instructions at home:  Take over-the-counter and prescription medicines only as told by your health care provider.  If you were prescribed an antibiotic, take it as told by your health care provider. Do not stop taking the antibiotic even if you start to feel better.  Avoid alcohol, caffeine, tea, and carbonated beverages. They can irritate your bladder.  Drink enough fluid to keep your urine clear or pale yellow.  Keep all follow-up visits as told by your health care provider. This is important.  Make sure to: ? Empty your bladder often and completely. Do not hold urine for long periods of time. ? Empty your bladder before and after sex. ? Wipe from front to back after a bowel movement if you are female. Use each tissue one time when you wipe. Contact a health care provider if:  You have back pain.  You have a fever.  You feel nauseous or vomit.  Your symptoms do not  get better after 3 days.  Your symptoms go away and then return. Get help right away if:  You have severe back pain or lower abdominal pain.  You are vomiting and cannot keep down any medicines or water. This information is not intended to replace advice given to you by your health care provider. Make sure you discuss any questions you have with your health care provider. Document Released:  12/25/2004 Document Revised: 08/29/2015 Document Reviewed: 02/05/2015 Elsevier Interactive Patient Education  2018 [employer].  How to Take a [employer] A sitz bath is a warm water bath that is taken while you are sitting down. The water should only come up to your hips and should cover your buttocks. Your health care provider may recommend a sitz bath to help you:  Clean the lower part of your body, including your genital area.  With itching.  With pain.  With sore muscles or muscles that tighten or spasm.  How to take a sitz bath Take 3-4 sitz baths per day or as told by your health care provider. 1. Partially fill a bathtub with warm water. You will only need the water to be deep enough to cover your hips and buttocks when you are sitting in it. 2. If your health care provider told you to put medicine in the water, follow the directions exactly. 3. Sit in the water and open the tub drain a little. 4. Turn on the warm water again to keep the tub at the correct level. Keep the water running constantly. 5. Soak in the water for 15-20 minutes or as told by your health care provider. 6. After the sitz bath, pat the affected area dry first. Do not rub it. 7. Be careful when you stand up after the sitz bath because you may feel dizzy.  Contact a health care provider if:  Your symptoms get worse. Do not continue with sitz baths if your symptoms get worse.  You have new symptoms. Do not continue with sitz baths until you talk with your health care provider. This information is not intended to replace advice given to you by your health care provider. Make sure you discuss any questions you have with your health care provider. Document Released: 12/08/2003 Document Revised: 08/15/2015 Document Reviewed: 03/15/2014 Elsevier Interactive Patient Education  2018 [employer].  Disposable Sitz Bath A disposable sitz bath is a plastic basin that fits over the toilet. A bag is hung above the  toilet, and the bag is connected to a tube that opens into the basin. The bag is filled with warm water that flows into the basin through the tube. A sitz bath can be used to help relieve symptoms, clean, and promote healing in the genital and anal areas, as well as in the lower abdomen and buttocks. What are the risks? Sitz baths are generally very safe. It is possible for the skin between the genitals and the anus (perineum) to become infected, but this is rare. You can avoid this by cleaning your sitz bath supplies thoroughly. How to use a disposable sitz bath 1. Close the clamp on the tube. Make sure the clamp is closed tightly to prevent leakage. 2. Fill the sitz bath basin and the plastic bag with warm water. The water should be warm enough to be comfortable, but not hot. 3. Raise the toilet seat and place the filled basin on the toilet. Make sure the overflow opening is facing toward the back of the toilet. ? If  you prefer, you may place the empty basin on the toilet first, and then use the plastic bag to fill the basin with warm water. 4. Hang the filled plastic bag overhead on a hook or towel rack close to the toilet. The bag should be higher than the toilet so that the water will flow down through the tube. 5. Attach the tube to the opening on the basin. Make sure that the tube is attached to the basin tightly to prevent leakage. 6. Sit on the basin and release the clamp. This will allow warm water to flow into the basin and flush the area around your genitals and anus. 7. Remain sitting on the basin for about 15-20 minutes, or as long as told by your health care provider. 8. Stand up and gently pat your skin dry. If directed, apply clean bandages (dressings) to the affected area as told by your health care provider. 9. Carefully remove the basin from the toilet seat and tip the basin into the toilet to empty any remaining water. Empty any remaining water from the plastic bag into the toilet.  Then, flush the toilet. 10. Wash the basin with warm water and soap. Let the basin air dry in the sink. You should also let the plastic bag and the tubing air dry. 11. Store the basin, tubing, and plastic bag in a clean, dry area. 12. Wash your hands with soap and water. If soap and water are not available, use hand sanitizer. Contact a health care provider if:  You have symptoms that get worse instead of better.  You develop new skin irritation, redness, or swelling around your genitals or anus. This information is not intended to replace advice given to you by your health care provider. Make sure you discuss any questions you have with your health care provider. Document Released: 09/16/2011 Document Revised: 08/23/2015 Document Reviewed: 02/04/2015 Elsevier Interactive Patient Education  [employer].

## 2017-08-22 NOTE — MAU Note (Signed)
States she "feels weird" when she urinates. No burning, increase frequency.

## 2017-08-22 NOTE — MAU Provider Note (Signed)
History   G3P3003 S/P c/s  And BTL on 08/03/17 in with c/o urinary frequency and "not feeling right down there". This has been goin on for several days.  CSN: [id number]  Arrival date & time 08/22/17  1726   None     Chief Complaint  Patient presents with  . Urinary Frequency    HPI  Past Medical History:  Diagnosis Date  . Complication of anesthesia   . Eczema   . Medical history non-contributory   . Spinal headache     Past Surgical History:  Procedure Laterality Date  . addenoidectomy    . BILATERAL SALPINGECTOMY Bilateral 08/03/2017   Procedure: BILATERAL SALPINGECTOMY;  Surgeon: Osborn Coho, MD;  Location: [facility] BIRTHING SUITES;  Service: Obstetrics;  Laterality: Bilateral;  . CESAREAN SECTION N/A 02/03/2015   Procedure: CESAREAN SECTION;  Surgeon: Jaymes Graff, MD;  Location: [facility] ORS;  Service: Obstetrics;  Laterality: N/A;  . CESAREAN SECTION N/A 07/30/2016   Procedure: CESAREAN SECTION;  Surgeon: Catalina Antigua, MD;  Location: [facility] BIRTHING SUITES;  Service: Obstetrics;  Laterality: N/A;  . CESAREAN SECTION N/A 08/03/2017   Procedure: REPEAT CESAREAN SECTION;  Surgeon: Osborn Coho, MD;  Location: [facility] BIRTHING SUITES;  Service: Obstetrics;  Laterality: N/A;  . EYE SURGERY    . TONSILLECTOMY      Family History  Problem Relation Age of Onset  . Arthritis Maternal Grandmother   . Heart disease Maternal Grandmother   . Miscarriages / Stillbirths Maternal Grandmother   . Cancer Maternal Grandfather   . Alcohol abuse Paternal Grandmother   . Hypertension Mother   . Hypertension Father   . Heart murmur Sister   . Birth defects Maternal Uncle        short leg    Social History   Tobacco Use  . Smoking status: Never Smoker  . Smokeless tobacco: Never Used  Substance Use Topics  . Alcohol use: No  . Drug use: No    OB History    Gravida  3   Para  3   Term  3   Preterm      AB      Living  2     SAB      TAB      Ectopic      Multiple  0    Live Births  2           Review of Systems  Constitutional: Negative.   HENT: Negative.   Eyes: Negative.   Respiratory: Negative.   Cardiovascular: Negative.   Gastrointestinal: Negative.   Endocrine: Negative.   Genitourinary: Positive for frequency and urgency.  Musculoskeletal: Negative.   Skin: Negative.   Allergic/Immunologic: Negative.   Neurological: Negative.   Hematological: Negative.   Psychiatric/Behavioral: Negative.     Allergies  Zyrtec allergy [cetirizine hcl]  Home Medications   Current Outpatient Rx  . Order #: [id number] Class: Normal    BP 128/85 (BP Location: Left Arm)   Pulse 85   Temp 98.3 F (36.8 C) (Oral)   Resp 18   Ht 5' (1.524 m)   Wt 210 lb 12 oz (95.6 kg)   BMI 41.16 kg/m   Physical Exam  Constitutional: She is oriented to person, place, and time. She appears well-developed and well-nourished.  HENT:  Head: Normocephalic.  Neck: Normal range of motion.  Cardiovascular: Normal rate, regular rhythm, normal heart sounds and intact distal pulses.  Pulmonary/Chest: Effort normal and breath sounds normal.  Abdominal: Soft. Bowel  sounds are normal.  Genitourinary:  Genitourinary Comments: 2cm cystic abcess obstructing opening of urethra  Musculoskeletal: Normal range of motion.  Neurological: She is alert and oriented to person, place, and time.  Skin: Skin is warm and dry.  Psychiatric: She has a normal mood and affect. Her behavior is normal. Judgment and thought content normal.  Nursing note and vitals reviewed.   MAU Course  Procedures (including critical care time)  Labs Reviewed  URINALYSIS, ROUTINE W REFLEX MICROSCOPIC - Abnormal; Notable for the following components:      Result Value   APPearance HAZY (*)    Hgb urine dipstick MODERATE (*)    Nitrite POSITIVE (*)    Leukocytes, UA LARGE (*)    WBC, UA >50 (*)    Bacteria, UA MANY (*)    All other components within normal limits  URINE CULTURE   No results  found.   1. Other urinary tract infection following delivery   2. Skene's duct cyst       MDM  Bartholin Cyst I&D and Word Catheter Placement Enlarged abscess just below urethral opening apx 2 cm in size  Written informed consent was obtained.  Discussed complications and possible outcomes of procedure including recurrence of cyst, scarring leading to infection, bleeding, dyspareunia, distortion of anatomy.  Patient was examined in the dorsal lithotomy position and mass was identified.  The area was prepped with Iodine and draped in a sterile manner. 1% Lidocaine (3 ml) was then used to infiltrate area on top of the cyst, behind the hymenal ring.  A 7 mm incision was made using a sterile scapel. Upon palpation of the mass, a moderate amount of bloody purulent drainage was expressed through the incision..  Patient tolerated the procedure well, reported feeling " a lot better." - Recommended Sitz baths bid . She was told to call to be examined if she experiences increasing swelling, pain, vaginal discharge, or fever.  - She was instructed to wear a peripad to absorb discharge, Pt was d/c home on Macrobid and culture sent. To f/u in office next week.

## 2017-08-25 LAB — URINE CULTURE

## 2017-08-31 ENCOUNTER — Inpatient Hospital Stay (HOSPITAL_COMMUNITY)
Admission: AD | Admit: 2017-08-31 | Discharge: 2017-08-31 | Disposition: A | Discharge status: Home / Self Care | Payer: Medicaid Other | Source: Ambulatory Visit | Attending: Obstetrics and Gynecology | Admitting: Obstetrics and Gynecology

## 2017-08-31 ENCOUNTER — Encounter (HOSPITAL_COMMUNITY): Payer: Self-pay | Admitting: *Deleted

## 2017-08-31 DIAGNOSIS — R3915 Urgency of urination: Secondary | ICD-10-CM | POA: Insufficient documentation

## 2017-08-31 DIAGNOSIS — Z90722 Acquired absence of ovaries, bilateral: Secondary | ICD-10-CM | POA: Insufficient documentation

## 2017-08-31 DIAGNOSIS — Z888 Allergy status to other drugs, medicaments and biological substances status: Secondary | ICD-10-CM | POA: Insufficient documentation

## 2017-08-31 DIAGNOSIS — Z9889 Other specified postprocedural states: Secondary | ICD-10-CM | POA: Insufficient documentation

## 2017-08-31 DIAGNOSIS — N368 Other specified disorders of urethra: Secondary | ICD-10-CM | POA: Diagnosis present

## 2017-08-31 HISTORY — DX: Vulvar cyst: N90.7

## 2017-08-31 LAB — URINALYSIS, ROUTINE W REFLEX MICROSCOPIC
Bilirubin Urine: NEGATIVE
Glucose, UA: NEGATIVE mg/dL
KETONES UR: NEGATIVE mg/dL
Nitrite: POSITIVE — AB
PROTEIN: NEGATIVE mg/dL
Specific Gravity, Urine: 1.019 (ref 1.005–1.030)
pH: 6 (ref 5.0–8.0)

## 2017-08-31 MED ORDER — PHENAZOPYRIDINE HCL 100 MG PO TABS: 100.0000 mg | ORAL_TABLET | Freq: Three times a day (TID) | ORAL | 0 refills | Status: AC | PRN

## 2017-08-31 NOTE — MAU Note (Signed)
Pt states she was here two weeks ago, had urethral cyst drained, was on antibiotics which she completed.  Had C/S 4 weeks ago.  States she doesn't "feel normal" with urination again, feels irritated.  Thinks she may have UTI.

## 2017-08-31 NOTE — MAU Provider Note (Signed)
Chief Complaint: urinary irritation   None    SUBJECTIVE HPI: Holly Glass is a 27 y.o. G3P3003 at Unknown who presents to [facility] reporting urethral irritation.  Pt just finished macrobid for a UTI.  Denies blood in urine.  Pt had a LTCS 4 weeks ago.  Pt has not been drinking any water today.  Location: urethra Quality: mod Duration: a few days   Past Medical History:  Diagnosis Date  . Complication of anesthesia   . Eczema   . Medical history non-contributory   . Spinal headache   . Vulvar cyst 08/22/2017   I & D   OB History  Gravida Para Term Preterm AB Living  3 3 3     3   SAB TAB Ectopic Multiple Live Births        0 3    # Outcome Date GA Lbr Len/2nd Weight Sex Delivery Anes PTL Lv  3 Term 08/03/17 [redacted]w[redacted]d   F CS-LTranv   LIV  2 Term 07/30/16 [redacted]w[redacted]d  3.465 kg (7 lb 10.2 oz) M CS-LVertical Spinal  LIV  1 Term 02/03/15 [redacted]w[redacted]d  3.555 kg (7 lb 13.4 oz) M CS-LTranv Spinal  LIV   Past Surgical History:  Procedure Laterality Date  . addenoidectomy    . BILATERAL SALPINGECTOMY Bilateral 08/03/2017   Procedure: BILATERAL SALPINGECTOMY;  Surgeon: Osborn Coho, MD;  Location: [facility] BIRTHING SUITES;  Service: Obstetrics;  Laterality: Bilateral;  . CESAREAN SECTION N/A 02/03/2015   Procedure: CESAREAN SECTION;  Surgeon: Jaymes Graff, MD;  Location: [facility] ORS;  Service: Obstetrics;  Laterality: N/A;  . CESAREAN SECTION N/A 07/30/2016   Procedure: CESAREAN SECTION;  Surgeon: Catalina Antigua, MD;  Location: [facility] BIRTHING SUITES;  Service: Obstetrics;  Laterality: N/A;  . CESAREAN SECTION N/A 08/03/2017   Procedure: REPEAT CESAREAN SECTION;  Surgeon: Osborn Coho, MD;  Location: [facility] BIRTHING SUITES;  Service: Obstetrics;  Laterality: N/A;  . EYE SURGERY    . TONSILLECTOMY     Social History   Socioeconomic History  . Marital status: Married    Spouse name: Not on file  . Number of children: Not on file  . Years of education: Not on file  . Highest education level: Not on  file  Occupational History  . Not on file  Social Needs  . Financial resource strain: Not on file  . Food insecurity:    Worry: Not on file    Inability: Not on file  . Transportation needs:    Medical: Not on file    Non-medical: Not on file  Tobacco Use  . Smoking status: Never Smoker  . Smokeless tobacco: Never Used  Substance and Sexual Activity  . Alcohol use: No  . Drug use: No  . Sexual activity: Not Currently  Lifestyle  . Physical activity:    Days per week: Not on file    Minutes per session: Not on file  . Stress: Not on file  Relationships  . Social connections:    Talks on phone: Not on file    Gets together: Not on file    Attends religious service: Not on file    Active member of club or organization: Not on file    Attends meetings of clubs or organizations: Not on file    Relationship status: Not on file  . Intimate partner violence:    Fear of current or ex partner: Not on file    Emotionally abused: Not on file    Physically abused: Not  on file    Forced sexual activity: Not on file  Other Topics Concern  . Not on file  Social History Narrative  . Not on file   Family History  Problem Relation Age of Onset  . Arthritis Maternal Grandmother   . Heart disease Maternal Grandmother   . Miscarriages / Stillbirths Maternal Grandmother   . Cancer Maternal Grandfather   . Alcohol abuse Paternal Grandmother   . Hypertension Mother   . Hypertension Father   . Heart murmur Sister   . Birth defects Maternal Uncle        short leg   No current facility-administered medications on file prior to encounter.    Current Outpatient Medications on File Prior to Encounter  Medication Sig Dispense Refill  . ibuprofen (ADVIL,MOTRIN) 600 MG tablet Take 1 tablet (600 mg total) by mouth every 6 (six) hours. 30 tablet 0  . nitrofurantoin, macrocrystal-monohydrate, (MACROBID) 100 MG capsule Take 1 capsule (100 mg total) by mouth 2 (two) times daily. 10 capsule 0  .  oxyCODONE (OXY IR/ROXICODONE) 5 MG immediate release tablet Take 1 tablet (5 mg total) by mouth every 4 (four) hours as needed (pain scale 4-7). 10 tablet 0  . prenatal vitamin w/FE, FA (PRENATAL 1 + 1) 27-1 MG TABS tablet Take 1 tablet by mouth daily.      Allergies  Allergen Reactions  . Zyrtec Allergy [Cetirizine Hcl] Hives    Only Liquid zyrtec gives allergy    I have reviewed patient's Past Medical Hx, Surgical Hx, Family Hx, Social Hx, medications and allergies.   Review of Systems  Constitutional: Negative.   HENT: Negative.   Eyes: Negative.   Respiratory: Negative.   Cardiovascular: Negative.   Gastrointestinal: Negative.   Endocrine: Negative.   Genitourinary: Positive for dysuria.  Musculoskeletal: Negative.   Skin: Negative.   Allergic/Immunologic: Negative.   Neurological: Negative.   Hematological: Negative.   Psychiatric/Behavioral: Negative.     OBJECTIVE No data found. Constitutional: Well-developed, well-nourished female in no acute distress.  Cardiovascular: normal rate Respiratory: normal rate and effort.  GI: Abd soft, non-tender, gravid appropriate for gestational age. Pos BS x 4 MS: Extremities nontender, no edema, normal ROM Neurologic: Alert and oriented x 4.  GU: Neg CVAT.  Urethra examined and cyst noted.     LAB RESULTS Results for orders placed or performed during the hospital encounter of 08/31/17 (from the past 24 hour(s))  Urinalysis, Routine w reflex microscopic     Status: Abnormal   Collection Time: 08/31/17  6:22 PM  Result Value Ref Range   Color, Urine YELLOW YELLOW   APPearance CLOUDY (A) CLEAR   Specific Gravity, Urine 1.019 1.005 - 1.030   pH 6.0 5.0 - 8.0   Glucose, UA NEGATIVE NEGATIVE mg/dL   Hgb urine dipstick LARGE (A) NEGATIVE   Bilirubin Urine NEGATIVE NEGATIVE   Ketones, ur NEGATIVE NEGATIVE mg/dL   Protein, ur NEGATIVE NEGATIVE mg/dL   Nitrite POSITIVE (A) NEGATIVE   Leukocytes, UA SMALL (A) NEGATIVE   RBC / HPF  11-20 0 - 5 RBC/hpf   WBC, UA 11-20 0 - 5 WBC/hpf   Bacteria, UA MANY (A) NONE SEEN   Squamous Epithelial / LPF 11-20 0 - 5   Mucus PRESENT     IMAGING No results found.  MAU COURSE Orders Placed This Encounter  Procedures  . Urinalysis, Routine w reflex microscopic  . Discharge patient Discharge disposition: 01-Home or Self Care; Discharge patient date: 08/31/2017   Meds  ordered this encounter  Medications  . phenazopyridine (PYRIDIUM) 100 MG tablet    Sig: Take 1 tablet (100 mg total) by mouth 3 (three) times daily as needed for up to 3 days for pain.    Dispense:  9 tablet    Refill:  0    MDM UA done, UC sent.  VSS stable  D/C home and will call in 48 hours for culture result. ASSESSMENT 1. Urinary urgency     PLAN Discharge home in stable condition. Fever or increased pain precautions  Allergies as of 08/31/2017      Reactions   Zyrtec Allergy [cetirizine Hcl] Hives   Only Liquid zyrtec gives allergy      Medication List    STOP taking these medications   nitrofurantoin (macrocrystal-monohydrate) 100 MG capsule Commonly known as:  MACROBID     TAKE these medications   ibuprofen 600 MG tablet Commonly known as:  ADVIL,MOTRIN Take 1 tablet (600 mg total) by mouth every 6 (six) hours.   oxyCODONE 5 MG immediate release tablet Commonly known as:  Oxy IR/ROXICODONE Take 1 tablet (5 mg total) by mouth every 4 (four) hours as needed (pain scale 4-7).   phenazopyridine 100 MG tablet Commonly known as:  PYRIDIUM Take 1 tablet (100 mg total) by mouth 3 (three) times daily as needed for up to 3 days for pain.   prenatal vitamin w/FE, FA 27-1 MG Tabs tablet Take 1 tablet by mouth daily.        Kenney Houseman, CNM 08/31/2017  8:12 PM

## 2022-07-12 ENCOUNTER — Inpatient Hospital Stay (HOSPITAL_COMMUNITY)
Admission: AD | Admit: 2022-07-12 | Discharge: 2022-07-12 | Disposition: A | Discharge status: Home / Self Care | Payer: Medicaid Other | Attending: Obstetrics & Gynecology | Admitting: Obstetrics & Gynecology

## 2022-07-12 DIAGNOSIS — Z9851 Tubal ligation status: Secondary | ICD-10-CM | POA: Insufficient documentation

## 2022-07-12 DIAGNOSIS — R1030 Lower abdominal pain, unspecified: Secondary | ICD-10-CM | POA: Insufficient documentation

## 2022-07-12 DIAGNOSIS — Z3202 Encounter for pregnancy test, result negative: Secondary | ICD-10-CM | POA: Insufficient documentation

## 2022-07-12 DIAGNOSIS — M545 Low back pain, unspecified: Secondary | ICD-10-CM | POA: Insufficient documentation

## 2022-07-12 LAB — URINALYSIS, ROUTINE W REFLEX MICROSCOPIC
Bilirubin Urine: NEGATIVE
Glucose, UA: NEGATIVE mg/dL
Hgb urine dipstick: NEGATIVE
Ketones, ur: NEGATIVE mg/dL
Leukocytes,Ua: NEGATIVE
Nitrite: NEGATIVE
Protein, ur: NEGATIVE mg/dL
Specific Gravity, Urine: 1.014 (ref 1.005–1.030)
pH: 7 (ref 5.0–8.0)

## 2022-07-12 LAB — POCT PREGNANCY, URINE: Preg Test, Ur: NEGATIVE

## 2022-07-12 NOTE — MAU Provider Note (Signed)
Event Date/Time   First Provider Initiated Contact with Patient 07/12/22 1123      S Ms. RA CLENDANIEL is a 32 y.o. [phone] patient who presents to MAU today with complaint of lower abdominal cramping and low back pain. Pain score is 4/10. These are new complaints, onset with her menstrual cycle but continuing even once her bleeding subsided. This is unusual for her. She has attempted treatment with OTC Azo.  Hx includes BTL 07/2017. She denies dysuria, abdominal tenderness. She reports negative UPT at home.  O BP 136/80 (BP Location: Right Arm)   Pulse 74   Temp 98.7 F (37.1 C) (Oral)   Resp 17   SpO2 100%   Physical Exam Vitals and nursing note reviewed. Exam conducted with a chaperone present.  Constitutional:      Appearance: She is well-developed.  Cardiovascular:     Rate and Rhythm: Normal rate.  Pulmonary:     Effort: Pulmonary effort is normal.  Abdominal:     Tenderness: There is no abdominal tenderness.  Skin:    General: Skin is warm and dry.  Neurological:     Mental Status: She is alert.    A Medical screening exam complete Patient well-appearing, VSS, no acute distress or complaints Hx BTL 07/2017 Negative UPT in MAU Patient declined Quant hCG and vaginal swabs, agreeable to UA  Results for orders placed or performed during the hospital encounter of 07/12/22 (from the past 24 hour(s))  Pregnancy, urine POC     Status: None   Collection Time: 07/12/22 11:23 AM  Result Value Ref Range   Preg Test, Ur NEGATIVE NEGATIVE  Urinalysis, Routine w reflex microscopic -Urine, Clean Catch     Status: None   Collection Time: 07/12/22 11:29 AM  Result Value Ref Range   Color, Urine YELLOW YELLOW   APPearance CLEAR CLEAR   Specific Gravity, Urine 1.014 1.005 - 1.030   pH 7.0 5.0 - 8.0   Glucose, UA NEGATIVE NEGATIVE mg/dL   Hgb urine dipstick NEGATIVE NEGATIVE   Bilirubin Urine NEGATIVE NEGATIVE   Ketones, ur NEGATIVE NEGATIVE mg/dL   Protein, ur NEGATIVE  NEGATIVE mg/dL   Nitrite NEGATIVE NEGATIVE   Leukocytes,Ua NEGATIVE NEGATIVE    P Discharge from MAU in stable condition Patient may trial Ibuprofen for symptoms, follow up with PCP Patient may return to MAU as needed for acute OB/GYN concerns  Clayton Bibles, MSA, MSN, CNM 07/12/2022 3:15 PM

## 2022-07-12 NOTE — MAU Note (Addendum)
.  Holly Glass is a 32 y.o. at Unknown here in MAU reporting: lower abdominal cramping and back pain that started last Saturday with her period. She states her period ended on Wednesday, but the whole time her bleeding was light. Patient had a tubal ligation in 2019. Patient has been taking ibuprofen for the pain.  LMP: 07/05/22  Pain score: 4 Vitals:   07/12/22 1114  BP: 136/80  Pulse: 74  Resp: 17  Temp: 98.7 F (37.1 C)  SpO2: 100%      Lab orders placed from triage:  UPT
